# Patient Record
Sex: Female | Born: 1970 | Race: Black or African American | Hispanic: No | Marital: Single | Smoking: Never smoker
Health system: Southern US, Community
[De-identification: ages and names within clinical notes are randomized; demographics above are authoritative.]

## PROBLEM LIST (undated history)

## (undated) DIAGNOSIS — I5022 Chronic systolic (congestive) heart failure: Secondary | ICD-10-CM

## (undated) DIAGNOSIS — I428 Other cardiomyopathies: Secondary | ICD-10-CM

## (undated) DIAGNOSIS — R002 Palpitations: Secondary | ICD-10-CM

## (undated) DIAGNOSIS — M549 Dorsalgia, unspecified: Secondary | ICD-10-CM

## (undated) DIAGNOSIS — I10 Essential (primary) hypertension: Secondary | ICD-10-CM

## (undated) DIAGNOSIS — R079 Chest pain, unspecified: Secondary | ICD-10-CM

## (undated) HISTORY — DX: Palpitations: R00.2

## (undated) HISTORY — DX: Dorsalgia, unspecified: M54.9

## (undated) HISTORY — DX: Essential (primary) hypertension: I10

## (undated) HISTORY — DX: Chest pain, unspecified: R07.9

---

## 2008-08-23 HISTORY — PX: MYOMECTOMY: SHX85

## 2009-05-28 ENCOUNTER — Inpatient Hospital Stay (HOSPITAL_COMMUNITY): Admission: RE | Admit: 2009-05-28 | Discharge: 2009-05-30 | Payer: Self-pay | Admitting: Obstetrics and Gynecology

## 2009-05-28 ENCOUNTER — Encounter (INDEPENDENT_AMBULATORY_CARE_PROVIDER_SITE_OTHER): Payer: Self-pay | Admitting: Obstetrics and Gynecology

## 2010-11-26 LAB — BASIC METABOLIC PANEL
BUN: 9 mg/dL (ref 6–23)
Chloride: 106 mEq/L (ref 96–112)
Creatinine, Ser: 0.75 mg/dL (ref 0.4–1.2)
GFR calc non Af Amer: >60 mL/min (ref >60)

## 2010-11-26 LAB — URINALYSIS, ROUTINE W REFLEX MICROSCOPIC
Bilirubin Urine: NEGATIVE
Nitrite: NEGATIVE
Specific Gravity, Urine: 1.01 (ref 1.005–1.030)
Urobilinogen, UA: 0.2 mg/dL (ref 0.0–1.0)

## 2010-11-26 LAB — CBC
HCT: 22.1 % — ABNORMAL LOW (ref 36.0–46.0)
Hemoglobin: 7.4 g/dL — CL (ref 12.0–15.0)
MCHC: 33.3 g/dL (ref 30.0–36.0)
MCV: 81.2 fL (ref 78.0–100.0)
MCV: 81.8 fL (ref 78.0–100.0)
Platelets: 279 10*3/uL (ref 150–400)
RBC: 2.72 MIL/uL — ABNORMAL LOW (ref 3.87–5.11)
WBC: 5.1 10*3/uL (ref 4.0–10.5)

## 2010-11-26 LAB — TSH: TSH: 0.952 u[IU]/mL (ref 0.350–4.500)

## 2010-11-26 LAB — PREGNANCY, URINE: Preg Test, Ur: NEGATIVE

## 2010-11-26 LAB — URINE MICROSCOPIC-ADD ON

## 2014-04-19 ENCOUNTER — Encounter: Payer: Self-pay | Admitting: *Deleted

## 2019-04-24 DEATH — deceased

## 2021-01-28 ENCOUNTER — Other Ambulatory Visit: Payer: Self-pay | Admitting: Radiology

## 2021-02-03 ENCOUNTER — Inpatient Hospital Stay (HOSPITAL_COMMUNITY)
Admission: EM | Admit: 2021-02-03 | Discharge: 2021-02-06 | DRG: 286 | Disposition: A | Discharge status: Home / Self Care | Payer: BC Managed Care – PPO | Attending: Internal Medicine | Admitting: Internal Medicine

## 2021-02-03 ENCOUNTER — Encounter (HOSPITAL_COMMUNITY): Payer: Self-pay

## 2021-02-03 DIAGNOSIS — I110 Hypertensive heart disease with heart failure: Secondary | ICD-10-CM | POA: Diagnosis not present

## 2021-02-03 DIAGNOSIS — I5021 Acute systolic (congestive) heart failure: Secondary | ICD-10-CM | POA: Diagnosis present

## 2021-02-03 DIAGNOSIS — I459 Conduction disorder, unspecified: Secondary | ICD-10-CM | POA: Diagnosis present

## 2021-02-03 DIAGNOSIS — R7989 Other specified abnormal findings of blood chemistry: Secondary | ICD-10-CM

## 2021-02-03 DIAGNOSIS — I272 Pulmonary hypertension, unspecified: Secondary | ICD-10-CM | POA: Diagnosis present

## 2021-02-03 DIAGNOSIS — I517 Cardiomegaly: Secondary | ICD-10-CM | POA: Diagnosis not present

## 2021-02-03 DIAGNOSIS — I428 Other cardiomyopathies: Secondary | ICD-10-CM | POA: Diagnosis present

## 2021-02-03 DIAGNOSIS — Z8042 Family history of malignant neoplasm of prostate: Secondary | ICD-10-CM

## 2021-02-03 DIAGNOSIS — N289 Disorder of kidney and ureter, unspecified: Secondary | ICD-10-CM | POA: Diagnosis present

## 2021-02-03 DIAGNOSIS — I371 Nonrheumatic pulmonary valve insufficiency: Secondary | ICD-10-CM | POA: Diagnosis present

## 2021-02-03 DIAGNOSIS — Z8249 Family history of ischemic heart disease and other diseases of the circulatory system: Secondary | ICD-10-CM

## 2021-02-03 DIAGNOSIS — E876 Hypokalemia: Secondary | ICD-10-CM | POA: Diagnosis present

## 2021-02-03 DIAGNOSIS — R0683 Snoring: Secondary | ICD-10-CM | POA: Diagnosis present

## 2021-02-03 DIAGNOSIS — Z20822 Contact with and (suspected) exposure to covid-19: Secondary | ICD-10-CM | POA: Diagnosis present

## 2021-02-03 DIAGNOSIS — I509 Heart failure, unspecified: Secondary | ICD-10-CM

## 2021-02-03 DIAGNOSIS — Z79899 Other long term (current) drug therapy: Secondary | ICD-10-CM

## 2021-02-03 DIAGNOSIS — I5042 Chronic combined systolic (congestive) and diastolic (congestive) heart failure: Secondary | ICD-10-CM | POA: Diagnosis present

## 2021-02-03 DIAGNOSIS — R609 Edema, unspecified: Secondary | ICD-10-CM

## 2021-02-03 NOTE — ED Triage Notes (Signed)
Pt reports that she has bilateral leg swelling that has been going on for a few months, cough that goes on at night, denies SOB

## 2021-02-03 NOTE — ED Provider Notes (Signed)
Emergency Medicine Provider Triage Evaluation Note  Wendy Stephens , a 50 y.o. female  was evaluated in triage.  Pt complains of peripheral edema, worsening over 3 weeks.  Some new cough and SOB with lying flat and exertion.  Denies chest pain.  Doctor recently changed BP meds.  Has been on lasix for 2 days now without change.  Review of Systems  Positive: Peripheral edema, SOB Negative: Chest pain, abdominal pain  Physical Exam  BP (!) 145/97 (BP Location: Left Arm)   Pulse 88   Temp (!) 97.5 F (36.4 C) (Oral)   Resp 18   SpO2 98%  Gen:   Awake, no distress   Resp:  Normal effort, grossly clear MSK:   Moves extremities without difficulty, 2+ pitting edema from ankles up to knees  Medical Decision Making  Medically screening exam initiated at 11:55 PM.  Appropriate orders placed.  SHONICE WRISLEY was informed that the remainder of the evaluation will be completed by another provider, this initial triage assessment does not replace that evaluation, and the importance of remaining in the ED until their evaluation is complete.   Garlon Hatchet, PA-C 02/03/21 2357    Milagros Loll, MD 02/05/21 [phone]

## 2021-02-04 ENCOUNTER — Emergency Department (HOSPITAL_COMMUNITY): Payer: BC Managed Care – PPO

## 2021-02-04 DIAGNOSIS — I509 Heart failure, unspecified: Secondary | ICD-10-CM

## 2021-02-04 DIAGNOSIS — I10 Essential (primary) hypertension: Secondary | ICD-10-CM

## 2021-02-04 LAB — CBC WITH DIFFERENTIAL/PLATELET
Abs Immature Granulocytes: 0.01 10*3/uL (ref 0.00–0.07)
Basophils Absolute: 0 10*3/uL (ref 0.0–0.1)
Basophils Relative: 1 %
Eosinophils Absolute: 0 10*3/uL (ref 0.0–0.5)
Eosinophils Relative: 1 %
HCT: 39.6 % (ref 36.0–46.0)
Hemoglobin: 12.8 g/dL (ref 12.0–15.0)
Immature Granulocytes: 0 %
Lymphocytes Relative: 55 %
Lymphs Abs: 2.7 10*3/uL (ref 0.7–4.0)
MCH: 26.9 pg (ref 26.0–34.0)
MCHC: 32.3 g/dL (ref 30.0–36.0)
MCV: 83.4 fL (ref 80.0–100.0)
Monocytes Absolute: 0.3 10*3/uL (ref 0.1–1.0)
Monocytes Relative: 6 %
Neutro Abs: 1.8 10*3/uL (ref 1.7–7.7)
Neutrophils Relative %: 37 %
Platelets: 233 10*3/uL (ref 150–400)
RBC: 4.75 MIL/uL (ref 3.87–5.11)
RDW: 15.3 % (ref 11.5–15.5)
WBC: 4.8 10*3/uL (ref 4.0–10.5)
nRBC: 0 % (ref 0.0–0.2)

## 2021-02-04 LAB — BASIC METABOLIC PANEL
Anion gap: 7 (ref 5–15)
BUN: 20 mg/dL (ref 6–20)
CO2: 27 mmol/L (ref 22–32)
Calcium: 8.6 mg/dL — ABNORMAL LOW (ref 8.9–10.3)
Chloride: 103 mmol/L (ref 98–111)
Creatinine, Ser: 1.1 mg/dL — ABNORMAL HIGH (ref 0.44–1.00)
GFR, Estimated: >60 mL/min (ref >60)
Glucose, Bld: 107 mg/dL — ABNORMAL HIGH (ref 70–99)
Potassium: 3.7 mmol/L (ref 3.5–5.1)
Sodium: 137 mmol/L (ref 135–145)

## 2021-02-04 LAB — RESP PANEL BY RT-PCR (FLU A&B, COVID) ARPGX2
Influenza A by PCR: NEGATIVE
Influenza B by PCR: NEGATIVE
SARS Coronavirus 2 by RT PCR: NEGATIVE

## 2021-02-04 LAB — CBC
HCT: 41.5 % (ref 36.0–46.0)
Hemoglobin: 13.7 g/dL (ref 12.0–15.0)
MCH: 27.1 pg (ref 26.0–34.0)
MCHC: 33 g/dL (ref 30.0–36.0)
MCV: 82.2 fL (ref 80.0–100.0)
Platelets: 243 10*3/uL (ref 150–400)
RBC: 5.05 MIL/uL (ref 3.87–5.11)
RDW: 15.1 % (ref 11.5–15.5)
WBC: 5.3 10*3/uL (ref 4.0–10.5)
nRBC: 0 % (ref 0.0–0.2)

## 2021-02-04 LAB — TROPONIN I (HIGH SENSITIVITY): Troponin I (High Sensitivity): 17 ng/L (ref <18)

## 2021-02-04 LAB — BRAIN NATRIURETIC PEPTIDE: B Natriuretic Peptide: 1213.4 pg/mL — ABNORMAL HIGH (ref 0.0–100.0)

## 2021-02-04 LAB — TSH: TSH: 0.782 u[IU]/mL (ref 0.350–4.500)

## 2021-02-04 MED ORDER — SODIUM CHLORIDE 0.9% FLUSH
3.0000 mL | Freq: Two times a day (BID) | INTRAVENOUS | Status: DC
  Administered 2021-02-04 – 2021-02-05 (×2): 3 mL via INTRAVENOUS

## 2021-02-04 MED ORDER — SODIUM CHLORIDE 0.9% FLUSH: 3.0000 mL | INTRAVENOUS | Status: DC | PRN

## 2021-02-04 MED ORDER — ONDANSETRON HCL 4 MG/2ML IJ SOLN: 4.0000 mg | Freq: Four times a day (QID) | INTRAMUSCULAR | Status: DC | PRN

## 2021-02-04 MED ORDER — SACUBITRIL-VALSARTAN 24-26 MG PO TABS
1.0000 | ORAL_TABLET | Freq: Two times a day (BID) | ORAL | Status: DC
  Administered 2021-02-04 – 2021-02-05 (×3): 1 via ORAL
  Filled 2021-02-04 (×4): qty 1

## 2021-02-04 MED ORDER — SODIUM CHLORIDE 0.9 % IV SOLN: 250.0000 mL | INTRAVENOUS | Status: DC | PRN

## 2021-02-04 MED ORDER — FUROSEMIDE 10 MG/ML IJ SOLN
40.0000 mg | Freq: Once | INTRAMUSCULAR | Status: AC
  Administered 2021-02-04: 40 mg via INTRAVENOUS
  Filled 2021-02-04: qty 4

## 2021-02-04 MED ORDER — FUROSEMIDE 10 MG/ML IJ SOLN
40.0000 mg | Freq: Two times a day (BID) | INTRAMUSCULAR | Status: DC
  Administered 2021-02-04 – 2021-02-05 (×3): 40 mg via INTRAVENOUS
  Filled 2021-02-04 (×4): qty 4

## 2021-02-04 MED ORDER — ENOXAPARIN SODIUM 40 MG/0.4ML IJ SOSY
40.0000 mg | PREFILLED_SYRINGE | INTRAMUSCULAR | Status: DC
  Administered 2021-02-04 – 2021-02-05 (×2): 40 mg via SUBCUTANEOUS
  Filled 2021-02-04 (×2): qty 0.4

## 2021-02-04 MED ORDER — ACETAMINOPHEN 325 MG PO TABS: 650.0000 mg | ORAL_TABLET | ORAL | Status: DC | PRN

## 2021-02-04 NOTE — H&P (Addendum)
Cardiology Admission History and Physical:   Patient ID: Wendy Stephens MRN: [id number]; DOB: May 30, 1971   Admission date: 02/03/2021  PCP:  System, Provider Not In   [facility] Providers Cardiologist:  New to [facility]; To follow with Dr. Duke Salvia going forward.  Click here to update MD or APP on Care Team, Refresh:1}     Chief Complaint:  LE swelling and SOB  Patient Profile:   Wendy Stephens is a 50 y.o. female with a PMH of HTN, who is being seen 02/04/2021 for the evaluation of CHF.  History of Present Illness:   Wendy Stephens was in her usual state of health until about 6 months ago when she noticed gradual increase in LE edema. She has followed with her PCP for this issue in the past and increased her HCTZ, as well as taking intermittent short courses of lasix. Over the past month she has gain ~10 lbs with significant worsening of her LE edema over the past 3 weeks. She reported for the past week she has ben sleeping in a recliner due to orthopnea. She has had some DOE and occasionally notices a "rattling" sensation in her chest. Due to these issues she was scheduled to see Dr. Duke Salvia 03/05/21. She has never undergone any cardiac testing in the past. She reported increasing her lasix over the past couple days with no significant change in UOP. At the encouragement from a family member who was concerned about possible heart failure, she presented to the ED for further evaluation.  In the ED she is generally hypertensive, tachypneic, intermittently tachycardic, afebrile, satting well on RA. Labs notable for electrolytes wnl, Cr 1.1, CBC wnl, HsTrop 17, BNP 1213. EKG showed sinus rhythm, rate 84 bpm, non-specific IVCD, no STE/D. CXR showed cardiomegaly vs pericardial effusion and streaky atelectasis. She was given IV lasix 40mg  in the ED. Cardiology asked to evaluate.   At the time of this evaluation she reports some improvement in her LE edema. In hindsight her LE edema dates  back almost a year, starting with mild swelling in her ankles/feet. She has had some DOE more recently though only noticeable when going up a flight of stairs. She denies any chest pain at rest or with exertion. She has had orthopnea and coughing when laying down though no PND. She does reports snoring and daytime somnolence but has not had any prior sleep study testing to evaluate for OSA. She denies palpitations, recent viral illnesses, dizziness, lightheadedness, or syncope. She reports a strong family history of CHF - mother with ICD for cardiomyopathy in her 75s, ultimately passed from MI at 9; maternal aunt and grandmother with CHF, as well as a cousin who had SCD at age 67. Family had previously been recommended to undergo genetic testing, though this has not occurred. She denies tobacco use, drinks 1-2 glasses of wine per week, and denies recreational drug use.    Past Medical History:  Diagnosis Date   Back pain    Chest pain, unspecified    Hypertension    Palpitations     Past Surgical History:  Procedure Laterality Date   MYOMECTOMY  2010     Medications Prior to Admission: Prior to Admission medications   Medication Sig Start Date End Date Taking? Authorizing Provider  candesartan (ATACAND) 32 MG tablet Take 32 mg by mouth daily. 01/19/21  Yes [provider]  furosemide (LASIX) 20 MG tablet Take 20 mg by mouth daily.   Yes [provider]  sodium chloride (OCEAN) 0.65 % SOLN nasal spray Place 1 spray into both nostrils daily.   Yes [provider]     Allergies:   No Known Allergies  Social History:   Social History   Socioeconomic History   Marital status: Single    Spouse name: Not on file   Number of children: Not on file   Years of education: Not on file   Highest education level: Not on file  Occupational History   Not on file  Tobacco Use   Smoking status: Never   Smokeless tobacco: Not on file  Substance and Sexual Activity    Alcohol use: Yes    Comment: 1-2 per week   Drug use: No   Sexual activity: Not on file  Other Topics Concern   Not on file  Social History Narrative   Not on file   Social Determinants of Health   Financial Resource Strain: Not on file  Food Insecurity: Not on file  Transportation Needs: Not on file  Physical Activity: Not on file  Stress: Not on file  Social Connections: Not on file  Intimate Partner Violence: Not on file    Family History:   The patient's family history includes CAD in her mother; Hypertension in her mother; Prostate cancer in her father. There is no history of Anemia, Arrhythmia, Asthma, Clotting disorder, Fainting, Heart disease, Heart failure, or Hyperlipidemia.    ROS:  Please see the history of present illness.   All other ROS reviewed and negative.     Physical Exam/Data:   Vitals:   02/04/21 1435 02/04/21 1500 02/04/21 1515 02/04/21 1530  BP: (!) 131/91 135/89 136/89 117/83  Pulse: 80 76 77 80  Resp: (!) 27 (!) 23 (!) 23 15  Temp:      TempSrc:      SpO2: 100% 100% 100% 90%   No intake or output data in the 24 hours ending 02/04/21 1620 No flowsheet data found.   There is no height or weight on file to calculate BMI.  General:  Well nourished, well developed, in no acute distress HEENT: sclera anicteric Lymph: no adenopathy Neck: no JVD Endocrine:  No thryomegaly Vascular: No carotid bruits; FA pulses 2+ bilaterally without bruits  Cardiac:  normal S1, S2; RRR; no murmur Lungs:  clear to auscultation bilaterally, no wheezing, rhonchi or rales  Abd: soft, nontender, no hepatomegaly  Ext: 2-3+ LE edema Musculoskeletal:  No deformities, BUE and BLE strength normal and equal Skin: warm and dry  Neuro:  CNs 2-12 intact, no focal abnormalities noted Psych:  Normal affect    EKG:  sinus rhythm, rate 84 bpm, non-specific IVCD, no STE/D; no comparison  Relevant CV Studies: Echo pending  Laboratory Data:  High Sensitivity Troponin:    Recent Labs  Lab 02/03/21 2356  TROPONINIHS 17      Chemistry Recent Labs  Lab 02/03/21 2356  NA 137  K 3.7  CL 103  CO2 27  GLUCOSE 107*  BUN 20  CREATININE 1.10*  CALCIUM 8.6*  GFRNONAA >60  ANIONGAP 7    No results for input(s): PROT, ALBUMIN, AST, ALT, ALKPHOS, BILITOT in the last 168 hours. Hematology Recent Labs  Lab 02/03/21 2356  WBC 4.8  RBC 4.75  HGB 12.8  HCT 39.6  MCV 83.4  MCH 26.9  MCHC 32.3  RDW 15.3  PLT 233   BNP Recent Labs  Lab 02/03/21 2356  BNP 1,213.4*    DDimer No results for  input(s): DDIMER in the last 168 hours.   Radiology/Studies:  DG Chest 2 View  Result Date: 02/04/2021 CLINICAL DATA:  Bilateral leg swelling. EXAM: CHEST - 2 VIEW COMPARISON:  None. FINDINGS: Streaky atelectatic changes including more bandlike subsegmental atelectasis or scarring in the lung bases. No consolidation, features of edema, pneumothorax, or effusion. Pulmonary vascularity remains normally distributed. Enlarged cardiac silhouette. Remaining cardiomediastinal contours are unremarkable. Levocurvature of the thoracolumbar junction. IMPRESSION: Enlarged cardiac silhouette, can reflect cardiomegaly versus pericardial effusion. Streaky atelectatic changes. Levocurvature of the thoracolumbar junction. Electronically Signed   By: Kreg Shropshire M.D.   On: 02/04/2021 00:21     Assessment and Plan:   Acute CHF: patient presented with progressive LE edema, DOE, weight gain, and orthopnea. EKG with non-specific IVCD though non-ischemic. HsTrop negative x1. BNP 1200. CXR suggested cardiomegaly vs pericardial effusion. She was given IV lasix 40mg  - UOP not documented. She has no prior cardiac history aside from HTN. She does have a strong family history of CHF as detailed above. Suspicions very high for combined CHF - Will check an echocardiogram - Will check TSH/FLP/A1C for risk stratification - Continue IV lasix 40mg  BID - Will monitor strict I&Os and daily weights -  Will monitor electrolytes closely and replete as needed to maintain K >4, Mg >2 - Anticipate addition of Bblocker prior to discharge once out of the acute phase - Will start entresto and titrate as tolerated.   HTN: BP mildly elevated.   - Anticipate management in the context of CHF  Suspected OSA: patient reports snoring and daytime somnolence.  - Would benefit from an outpatient sleep study   Risk Assessment/Risk Scores:    New York Heart Association (NYHA) Functional Class NYHA Class II     Severity of Illness: The appropriate patient status for this patient is OBSERVATION. Observation status is judged to be reasonable and necessary in order to provide the required intensity of service to ensure the patient's safety. The patient's presenting symptoms, physical exam findings, and initial radiographic and laboratory data in the context of their medical condition is felt to place them at decreased risk for further clinical deterioration. Furthermore, it is anticipated that the patient will be medically stable for discharge from the hospital within 2 midnights of admission. The following factors support the patient status of observation.   " The patient's presenting symptoms include LE edema, SOB, weight gain, orthopnea. " The physical exam findings include 2-3+ LE edema. " The initial radiographic and laboratory data are cardiomegaly on CXR, BNP 1200s.    For questions or updates, please contact [facility] Please consult [email or web address] for contact info under     Signed, , PA-C  02/04/2021 4:20 PM   Personally seen and examined. Agree with above.  50 year old female with strong family history of cardiomyopathy, worsening shortness of breath, weight gain, no chest pain no syncope no palpitations here for the evaluation of heart failure.  She has had 2 separate nosebleeds since July.  1 she had to go to the emergency department.   She also about a month ago had  a tick bite on her left upper leg posteriorly.  Currently fairly comfortable in bed.  Family surrounding.  Many of her family members see Dr. 54 in heart care.  Chest x-ray personally reviewed demonstrates cardiomegaly.  BNP is 1200.  EKG shows interventricular conduction delay with atrial enlargement.    On exam she is alert and oriented x3 in no acute distress.  Mild crackles heard at bases of lungs.  Heart is regular rate and rhythm with S3 gallop.  Midneck JVD noted.  2+ lower extremity edema.  Echocardiogram pending  Assessment and plan:  Acute heart failure, suspect systolic heart failure, possibly familial given her family history or in part hypertensive. -We will go ahead and admit her to the telemetry unit.  Place her on IV Lasix 40 mg twice daily.  Monitor her renal function closely.  Monitor electrolytes.  Check TSH. - When able/euvolemic, initiate beta-blocker, possibly carvedilol.  Her mother was on carvedilol previously. -I will go ahead and start Entresto.  Given her cardiomegaly on chest x-ray, I would suspect systolic dysfunction. -If systolic dysfunction is present, adding Jardiance or Marcelline Deist will also be important. -She will be n.p.o. past midnight.  If she is able to lay flat comfortably, we will consider cardiac catheterization.  Heart rate slightly too fast for optimal cardiac CT.  Donato Schultz, MD

## 2021-02-04 NOTE — ED Provider Notes (Signed)
50 year old female here with increased peripheral edema and orthopnea.  PCP has had on HCTZ and now Lasix without any improvement.  Chest x-ray reading is cardiomegaly versus effusion.  BNP elevated.  Bedside ultrasound done and does not show any significant cardiac effusion.  EF does seem a little bit down.  Will need cardiology consult likely admission for diuresis.  Ultrasound ED Echo  Date/Time: 02/04/2021 9:33 AM Performed by: Terrilee Files, MD Authorized by: Terrilee Files, MD   Procedure details:    Indications: dyspnea     Views: subxiphoid, parasternal long axis view, parasternal short axis view and apical 4 chamber view     Images: archived   Findings:    Pericardium: no pericardial effusion     LV Function: depressed (30 - 50%)   Impression:    Impression: decreased contractility      Terrilee Files, MD 02/04/21 2054

## 2021-02-04 NOTE — ED Provider Notes (Signed)
[facility] EMERGENCY DEPARTMENT Provider Note   CSN: [id number] Arrival date & time: 02/03/21  2339     History Chief Complaint  Patient presents with   Leg Swelling    Wendy Stephens is a 50 y.o. female with past medical history significant for hypertension who presents for evaluation of lower extremity swelling.  Patient states she has been having increased lower extremity swelling over the last 6 months however worse over the last 3 weeks.  Patient states PCP has tried increasing her HCTZ as well as intermittent Lasix bursts.  States over the last month she has gained 10 pounds. Her lower extremity swelling initially was in her feet and now progressed t from lower extremities to just proximal to knees.  She feels like her bilateral thighs are starting to tighten up as well without pitting edema. No surrounding erythema, warmth  She states over the last week she is having to sleep in a recliner and uses 3 pillows as well.  Admits to Orthopnea. She feels short of breath and that she intermittently has "rattling" in her chest.  She denies any chest pain.  She does get short of breath with exertion which has been chronic since July of last year. No known CAD however does have family hx of CAD.  She has had a cough productive of white sputum over the last 2 months.  Has appointment to follow-up with Dr. Duke Salvia in greater than 1 month with cardiology to establish care.  She has never had an echocardiogram.  No fever, chills, nausea, vomiting, abdominal pain, diarrhea, dysuria.  Took 2 days worth of 20 mg Lasix over the last 2 days however has not had any increase in urination.  Family member that is present works in the [employer] and is concerned about heart failure.  No pain.  Denies additional aggravating or alleviating factors.  History obtained from patient, family in room and past medical records.  No interpreter is used  PCP- [facility]  HPI     Past Medical History:   Diagnosis Date   Back pain    Chest pain, unspecified    Hypertension    Palpitations     There are no problems to display for this patient.   Past Surgical History:  Procedure Laterality Date   MYOMECTOMY  2010     OB History   No obstetric history on file.     Family History  Problem Relation Age of Onset   Hypertension Mother    CAD Mother    Prostate cancer Father    Anemia Neg Hx    Arrhythmia Neg Hx    Asthma Neg Hx    Clotting disorder Neg Hx    Fainting Neg Hx    Heart disease Neg Hx    Heart failure Neg Hx    Hyperlipidemia Neg Hx     Social History   Tobacco Use   Smoking status: Never  Substance Use Topics   Alcohol use: Yes    Comment: 1-2 per week   Drug use: No    Home Medications Prior to Admission medications   Medication Sig Start Date End Date Taking? Authorizing Provider  candesartan (ATACAND) 32 MG tablet Take 32 mg by mouth daily. 01/19/21  Yes [provider]  furosemide (LASIX) 20 MG tablet Take 20 mg by mouth daily.   Yes [provider]  sodium chloride (OCEAN) 0.65 % SOLN nasal spray Place 1 spray into both nostrils daily.  Yes [provider]    Allergies    Patient has no known allergies.  Review of Systems   Review of Systems  Constitutional:  Positive for fatigue. Negative for chills, diaphoresis, fever and unexpected weight change.  HENT: Negative.    Respiratory:  Positive for cough and shortness of breath. Negative for apnea, choking, chest tightness, wheezing and stridor.   Cardiovascular:  Positive for leg swelling (BL edema). Negative for chest pain and palpitations.  Gastrointestinal: Negative.   Genitourinary: Negative.   Musculoskeletal: Negative.   Skin: Negative.   Neurological: Negative.   All other systems reviewed and are negative.  Physical Exam Updated Vital Signs BP (!) 131/91   Pulse 80   Temp 98.3 F (36.8 C) (Oral)   Resp (!) 27   LMP 01/19/2021   SpO2 100%    Physical Exam Vitals and nursing note reviewed.  Constitutional:      General: She is not in acute distress.    Appearance: She is well-developed. She is not ill-appearing, toxic-appearing or diaphoretic.  HENT:     Head: Normocephalic and atraumatic.     Nose: Nose normal.     Mouth/Throat:     Mouth: Mucous membranes are moist.  Eyes:     Pupils: Pupils are equal, round, and reactive to light.  Cardiovascular:     Rate and Rhythm: Normal rate.     Pulses:          Radial pulses are 2+ on the right side and 2+ on the left side.       Dorsalis pedis pulses are 1+ on the right side and 1+ on the left side.     Heart sounds: Normal heart sounds.  Pulmonary:     Effort: Pulmonary effort is normal. No respiratory distress.     Comments: Mild crackles at bases. No respiratory distress Abdominal:     General: Bowel sounds are normal. There is no distension.     Tenderness: There is no abdominal tenderness. There is no guarding or rebound.     Comments: No anasarca  Musculoskeletal:        General: Normal range of motion.     Cervical back: Normal range of motion.     Right lower leg: 3+ Pitting Edema present.     Left lower leg: 3+ Pitting Edema present.     Comments: 2-3+ pitting edema to proximal knees bilaterally. No bony tenderness.  Compartments soft. Homans negative  Skin:    General: Skin is warm and dry.     Capillary Refill: Capillary refill takes less than 2 seconds.     Comments: Pitting edema bilateral lower extremities.  No overlying erythema or warmth  Neurological:     General: No focal deficit present.     Mental Status: She is alert.     Cranial Nerves: Cranial nerves are intact.     Sensory: Sensation is intact.     Gait: Gait is intact.     Comments: Intact sensation  Psychiatric:        Mood and Affect: Mood normal.    ED Results / Procedures / Treatments   Labs (all labs ordered are listed, but only abnormal results are displayed) Labs Reviewed   BASIC METABOLIC PANEL - Abnormal; Notable for the following components:      Result Value   Glucose, Bld 107 (*)    Creatinine, Ser 1.10 (*)    Calcium 8.6 (*)    All other components  within normal limits  BRAIN NATRIURETIC PEPTIDE - Abnormal; Notable for the following components:   B Natriuretic Peptide 1,213.4 (*)    All other components within normal limits  CBC WITH DIFFERENTIAL/PLATELET  TROPONIN I (HIGH SENSITIVITY)    EKG EKG Interpretation  Date/Time:  Wednesday February 04 2021 00:04:23 EDT Ventricular Rate:  84 PR Interval:  176 QRS Duration: 140 QT Interval:  424 QTC Calculation: 501 R Axis:   210 Text Interpretation: Normal sinus rhythm Biatrial enlargement Non-specific intra-ventricular conduction block Possible Lateral infarct , age undetermined Abnormal ECG No old tracing to compare Confirmed by Meridee Score [phone]) on 02/04/2021 9:01:34 AM  Radiology DG Chest 2 View  Result Date: 02/04/2021 CLINICAL DATA:  Bilateral leg swelling. EXAM: CHEST - 2 VIEW COMPARISON:  None. FINDINGS: Streaky atelectatic changes including more bandlike subsegmental atelectasis or scarring in the lung bases. No consolidation, features of edema, pneumothorax, or effusion. Pulmonary vascularity remains normally distributed. Enlarged cardiac silhouette. Remaining cardiomediastinal contours are unremarkable. Levocurvature of the thoracolumbar junction. IMPRESSION: Enlarged cardiac silhouette, can reflect cardiomegaly versus pericardial effusion. Streaky atelectatic changes. Levocurvature of the thoracolumbar junction. Electronically Signed   By: Kreg Shropshire M.D.   On: 02/04/2021 00:21    Procedures Procedures   Medications Ordered in ED Medications  furosemide (LASIX) injection 40 mg (40 mg Intravenous Given 02/04/21 1031)    ED Course  I have reviewed the triage vital signs and the nursing notes.  Pertinent labs & imaging results that were available during my care of the patient were  reviewed by me and considered in my medical decision making (see chart for details).  50 year old here for evaluation of lower extremity swelling as well as new onset PND orthopnea over the last week.  She is afebrile, nonseptic, not ill-appearing.  Unfortunately seems like her lower extremity swelling has been chronic her last few months and worsening despite home Lasix and increase of her HCTZ.  Has some crackles at her bases however no hypoxia or tachypnea.  Work-up started from triage which I personally reviewed and interpreted:  CBC without leukocytosis Metabolic panel glucose 107, creatinine 1.10, mild increase in baseline Trop 17 BNP one 1213.4 Chest x-ray shows cardiomegaly versus pericardial effusion  Bedside echo with attending Dr. Charm Barges does not show evidence of pericardial effusion.  Will give some Lasix.  We will touch base with cardiology for dispo  CONSULT with Trish with Cards. Will send someone to see patient in ED.  Patient reassessed. Cardiology has not been by to see as of yet. Discussed plan with patient and family. Agreeable to wait to see Cards for rec. Good diuresis with IV lasix here in ED.   Care transferred to [facility], PA-C who will FU on Cards rec and dispo appropriately.    MDM Rules/Calculators/A&P                           Final Clinical Impression(s) / ED Diagnoses Final diagnoses:  Peripheral edema  Elevated brain natriuretic peptide (BNP) level  Cardiomegaly    Rx / DC Orders ED Discharge Orders     None        Christophere Hillhouse A, PA-C 02/04/21 1521    Terrilee Files, MD 02/04/21 2054

## 2021-02-05 ENCOUNTER — Other Ambulatory Visit (HOSPITAL_COMMUNITY): Payer: Self-pay

## 2021-02-05 ENCOUNTER — Observation Stay (HOSPITAL_COMMUNITY): Payer: BC Managed Care – PPO

## 2021-02-05 ENCOUNTER — Encounter (HOSPITAL_COMMUNITY): Payer: Self-pay | Admitting: Cardiology

## 2021-02-05 DIAGNOSIS — I371 Nonrheumatic pulmonary valve insufficiency: Secondary | ICD-10-CM | POA: Diagnosis present

## 2021-02-05 DIAGNOSIS — I5021 Acute systolic (congestive) heart failure: Secondary | ICD-10-CM

## 2021-02-05 DIAGNOSIS — I459 Conduction disorder, unspecified: Secondary | ICD-10-CM | POA: Diagnosis present

## 2021-02-05 DIAGNOSIS — R7989 Other specified abnormal findings of blood chemistry: Secondary | ICD-10-CM | POA: Diagnosis present

## 2021-02-05 DIAGNOSIS — E876 Hypokalemia: Secondary | ICD-10-CM | POA: Diagnosis present

## 2021-02-05 DIAGNOSIS — N289 Disorder of kidney and ureter, unspecified: Secondary | ICD-10-CM | POA: Diagnosis present

## 2021-02-05 DIAGNOSIS — Z8042 Family history of malignant neoplasm of prostate: Secondary | ICD-10-CM | POA: Diagnosis not present

## 2021-02-05 DIAGNOSIS — Z006 Encounter for examination for normal comparison and control in clinical research program: Secondary | ICD-10-CM

## 2021-02-05 DIAGNOSIS — I110 Hypertensive heart disease with heart failure: Secondary | ICD-10-CM | POA: Diagnosis present

## 2021-02-05 DIAGNOSIS — I5042 Chronic combined systolic (congestive) and diastolic (congestive) heart failure: Secondary | ICD-10-CM

## 2021-02-05 DIAGNOSIS — I272 Pulmonary hypertension, unspecified: Secondary | ICD-10-CM | POA: Diagnosis present

## 2021-02-05 DIAGNOSIS — Z8249 Family history of ischemic heart disease and other diseases of the circulatory system: Secondary | ICD-10-CM | POA: Diagnosis not present

## 2021-02-05 DIAGNOSIS — I517 Cardiomegaly: Secondary | ICD-10-CM | POA: Diagnosis present

## 2021-02-05 DIAGNOSIS — I428 Other cardiomyopathies: Secondary | ICD-10-CM | POA: Diagnosis present

## 2021-02-05 DIAGNOSIS — R0683 Snoring: Secondary | ICD-10-CM | POA: Diagnosis present

## 2021-02-05 DIAGNOSIS — R0609 Other forms of dyspnea: Secondary | ICD-10-CM | POA: Diagnosis not present

## 2021-02-05 DIAGNOSIS — Z79899 Other long term (current) drug therapy: Secondary | ICD-10-CM | POA: Diagnosis not present

## 2021-02-05 DIAGNOSIS — Z20822 Contact with and (suspected) exposure to covid-19: Secondary | ICD-10-CM | POA: Diagnosis present

## 2021-02-05 HISTORY — DX: Chronic combined systolic (congestive) and diastolic (congestive) heart failure: I50.42

## 2021-02-05 LAB — BASIC METABOLIC PANEL
Anion gap: 8 (ref 5–15)
BUN: 21 mg/dL — ABNORMAL HIGH (ref 6–20)
CO2: 31 mmol/L (ref 22–32)
Calcium: 8.3 mg/dL — ABNORMAL LOW (ref 8.9–10.3)
Chloride: 102 mmol/L (ref 98–111)
Creatinine, Ser: 1.35 mg/dL — ABNORMAL HIGH (ref 0.44–1.00)
GFR, Estimated: 48 mL/min — ABNORMAL LOW (ref >60)
Glucose, Bld: 115 mg/dL — ABNORMAL HIGH (ref 70–99)
Potassium: 3.4 mmol/L — ABNORMAL LOW (ref 3.5–5.1)
Sodium: 141 mmol/L (ref 135–145)

## 2021-02-05 LAB — ECHOCARDIOGRAM COMPLETE
AR max vel: 1.28 cm2
AV Area VTI: 1.18 cm2
AV Area mean vel: 1.24 cm2
AV Mean grad: 4 mmHg
AV Peak grad: 7.3 mmHg
Ao pk vel: 1.35 m/s
Area-P 1/2: 6.17 cm2
Height: 61 in
S' Lateral: 4.3 cm
Weight: 2740.76 oz

## 2021-02-05 LAB — HEPATIC FUNCTION PANEL
ALT: 44 U/L (ref 0–44)
AST: 38 U/L (ref 15–41)
Albumin: 3 g/dL — ABNORMAL LOW (ref 3.5–5.0)
Alkaline Phosphatase: 43 U/L (ref 38–126)
Bilirubin, Direct: 0.5 mg/dL — ABNORMAL HIGH (ref 0.0–0.2)
Indirect Bilirubin: 1.3 mg/dL — ABNORMAL HIGH (ref 0.3–0.9)
Total Bilirubin: 1.8 mg/dL — ABNORMAL HIGH (ref 0.3–1.2)
Total Protein: 5.5 g/dL — ABNORMAL LOW (ref 6.5–8.1)

## 2021-02-05 LAB — HIV ANTIBODY (ROUTINE TESTING W REFLEX): HIV Screen 4th Generation wRfx: NONREACTIVE

## 2021-02-05 LAB — MRSA PCR SCREENING: MRSA by PCR: NEGATIVE

## 2021-02-05 MED ORDER — PERFLUTREN LIPID MICROSPHERE
1.0000 mL | INTRAVENOUS | Status: AC | PRN
  Administered 2021-02-05: 4 mL via INTRAVENOUS
  Filled 2021-02-05: qty 10

## 2021-02-05 MED ORDER — SPIRONOLACTONE 12.5 MG HALF TABLET
12.5000 mg | ORAL_TABLET | Freq: Every day | ORAL | Status: DC
  Administered 2021-02-05 – 2021-02-06 (×2): 12.5 mg via ORAL
  Filled 2021-02-05 (×2): qty 1

## 2021-02-05 MED ORDER — ASPIRIN 81 MG PO CHEW
81.0000 mg | CHEWABLE_TABLET | ORAL | Status: AC
  Administered 2021-02-06: 81 mg via ORAL
  Filled 2021-02-05: qty 1

## 2021-02-05 MED ORDER — POTASSIUM CHLORIDE CRYS ER 20 MEQ PO TBCR
40.0000 meq | EXTENDED_RELEASE_TABLET | Freq: Two times a day (BID) | ORAL | Status: AC
  Administered 2021-02-05 (×2): 40 meq via ORAL
  Filled 2021-02-05 (×2): qty 2

## 2021-02-05 MED ORDER — SODIUM CHLORIDE 0.9% FLUSH: 3.0000 mL | INTRAVENOUS | Status: DC | PRN

## 2021-02-05 MED ORDER — SODIUM CHLORIDE 0.9% FLUSH
3.0000 mL | Freq: Two times a day (BID) | INTRAVENOUS | Status: DC
  Administered 2021-02-05: 3 mL via INTRAVENOUS

## 2021-02-05 MED ORDER — SODIUM CHLORIDE 0.9 % IV SOLN: INTRAVENOUS | Status: DC

## 2021-02-05 MED ORDER — SODIUM CHLORIDE 0.9 % IV SOLN: 250.0000 mL | INTRAVENOUS | Status: DC | PRN

## 2021-02-05 NOTE — Research (Signed)
IDENTIFY Informed Consent                  Subject Name: Wendy Stephens    Subject met inclusion and exclusion criteria.  The informed consent form, study requirements and expectations were reviewed with the subject and questions and concerns were addressed prior to the signing of the consent form.  The subject verbalized understanding of the trial requirements.  The subject agreed to participate in the IDENTIFY trial and signed the informed consent at 16:47PM on 02/05/21.  The informed consent was obtained prior to performance of any protocol-specific procedures for the subject.  A copy of the signed informed consent was given to the subject and a copy was placed in the subject's medical record.   Meade Maw , Naval architect

## 2021-02-05 NOTE — Progress Notes (Signed)
Progress Note  Patient Name: Wendy Stephens Date of Encounter: 02/05/2021  [facility] Cardiologist: Chilton Si, MD   Subjective   Feels better, less SOB. Lost 10 pounds she states.   Inpatient Medications    Scheduled Meds:  enoxaparin (LOVENOX) injection  40 mg Subcutaneous Q24H   furosemide  40 mg Intravenous BID   sacubitril-valsartan  1 tablet Oral BID   sodium chloride flush  3 mL Intravenous Q12H   Continuous Infusions:  sodium chloride     PRN Meds: sodium chloride, acetaminophen, ondansetron (ZOFRAN) IV, perflutren lipid microspheres (DEFINITY) IV suspension, sodium chloride flush   Vital Signs    Vitals:   02/04/21 2305 02/05/21 0345 02/05/21 0620 02/05/21 0746  BP: 112/73 115/81  129/80  Pulse: 85 76  79  Resp: 20 18  18   Temp: 98.1 F (36.7 C) 98.4 F (36.9 C)  98.1 F (36.7 C)  TempSrc: Oral Oral  Oral  SpO2: 100% 99%  98%  Weight:   77.7 kg   Height:        Intake/Output Summary (Last 24 hours) at 02/05/2021 1041 Last data filed at 02/05/2021 0640 Gross per 24 hour  Intake 543 ml  Output 450 ml  Net 93 ml   Last 3 Weights 02/05/2021 02/04/2021  Weight (lbs) 171 lb 4.8 oz 174 lb 2.6 oz  Weight (kg) 77.7 kg 79 kg      Telemetry    NSR ICVD - Personally Reviewed  ECG    No new - Personally Reviewed  Physical Exam   GEN: No acute distress.   Neck: mid neck JVD Cardiac: RRR, no murmurs, rubs, S3 gallop.  Respiratory: Clear to auscultation bilaterally. GI: Soft, nontender, non-distended  MS: No edema; No deformity. Neuro:  Nonfocal  Psych: Normal affect   Labs    High Sensitivity Troponin:   Recent Labs  Lab 02/03/21 2356  TROPONINIHS 17      Chemistry Recent Labs  Lab 02/03/21 2356 02/05/21 0036  NA 137 141  K 3.7 3.4*  CL 103 102  CO2 27 31  GLUCOSE 107* 115*  BUN 20 21*  CREATININE 1.10* 1.35*  CALCIUM 8.6* 8.3*  GFRNONAA >60 48*  ANIONGAP 7 8     Hematology Recent Labs  Lab 02/03/21 2356  02/04/21 1918  WBC 4.8 5.3  RBC 4.75 5.05  HGB 12.8 13.7  HCT 39.6 41.5  MCV 83.4 82.2  MCH 26.9 27.1  MCHC 32.3 33.0  RDW 15.3 15.1  PLT 233 243    BNP Recent Labs  Lab 02/03/21 2356  BNP 1,213.4*     DDimer No results for input(s): DDIMER in the last 168 hours.   Radiology    DG Chest 2 View  Result Date: 02/04/2021 CLINICAL DATA:  Bilateral leg swelling. EXAM: CHEST - 2 VIEW COMPARISON:  None. FINDINGS: Streaky atelectatic changes including more bandlike subsegmental atelectasis or scarring in the lung bases. No consolidation, features of edema, pneumothorax, or effusion. Pulmonary vascularity remains normally distributed. Enlarged cardiac silhouette. Remaining cardiomediastinal contours are unremarkable. Levocurvature of the thoracolumbar junction. IMPRESSION: Enlarged cardiac silhouette, can reflect cardiomegaly versus pericardial effusion. Streaky atelectatic changes. Levocurvature of the thoracolumbar junction. Electronically Signed   By: 02/06/2021 M.D.   On: 02/04/2021 00:21   ECHOCARDIOGRAM COMPLETE  Result Date: 02/05/2021    ECHOCARDIOGRAM REPORT   Patient Name:   Wendy Stephens Date of Exam: 02/05/2021 Medical Rec #:  02/07/2021  Height:       61.0 in Accession #:    [id number]       Weight:       171.3 lb Date of Birth:  24-Oct-1970        BSA:          1.768 m Patient Age:    50 years         BP:           115/81 mmHg Patient Gender: F                HR:           76 bpm. Exam Location:  Inpatient Procedure: 2D Echo, Cardiac Doppler and Color Doppler Indications:    Dyspnea  History:        Patient has no prior history of Echocardiogram examinations.                 Risk Factors:Hypertension.  Sonographer:    Shirlean Kelly Referring Phys: [id number] Beatriz Stallion IMPRESSIONS  1. Left ventricular ejection fraction, by estimation, is 20 to 25%. The left ventricle has severely decreased function. The left ventricle demonstrates regional wall motion abnormalities  (see scoring diagram/findings for description). Left ventricular diastolic parameters are consistent with Grade II diastolic dysfunction (pseudonormalization). Elevated left ventricular end-diastolic pressure. There is akinesis of the left ventricular, entire inferoseptal wall, apical segment and inferior wall. There is akinesis of the left ventricular, apical anterior wall.  2. Right ventricular systolic function is normal. The right ventricular size is normal. Tricuspid regurgitation signal is inadequate for assessing PA pressure.  3. Left atrial size was mildly dilated.  4. Right atrial size was mildly dilated.  5. The mitral valve is normal in structure. Mild mitral valve regurgitation. No evidence of mitral stenosis.  6. The aortic valve is normal in structure. Aortic valve regurgitation is not visualized. No aortic stenosis is present.  7. The inferior vena cava is normal in size with greater than 50% respiratory variability, suggesting right atrial pressure of 3 mmHg. FINDINGS  Left Ventricle: Left ventricular ejection fraction, by estimation, is 20 to 25%. The left ventricle has severely decreased function. The left ventricle demonstrates regional wall motion abnormalities. The left ventricular internal cavity size was normal  in size. There is no left ventricular hypertrophy. Abnormal (paradoxical) septal motion, consistent with left bundle branch block. Left ventricular diastolic parameters are consistent with Grade II diastolic dysfunction (pseudonormalization). Elevated left ventricular end-diastolic pressure. Right Ventricle: The right ventricular size is normal. No increase in right ventricular wall thickness. Right ventricular systolic function is normal. Tricuspid regurgitation signal is inadequate for assessing PA pressure. Left Atrium: Left atrial size was mildly dilated. Right Atrium: Right atrial size was mildly dilated. Pericardium: There is no evidence of pericardial effusion. Mitral Valve: The  mitral valve is normal in structure. Mild mitral valve regurgitation. No evidence of mitral valve stenosis. Tricuspid Valve: The tricuspid valve is normal in structure. Tricuspid valve regurgitation is trivial. No evidence of tricuspid stenosis. Aortic Valve: The aortic valve is normal in structure. Aortic valve regurgitation is not visualized. No aortic stenosis is present. Aortic valve mean gradient measures 4.0 mmHg. Aortic valve peak gradient measures 7.3 mmHg. Aortic valve area, by VTI measures 1.18 cm. Pulmonic Valve: The pulmonic valve was normal in structure. Pulmonic valve regurgitation is mild. No evidence of pulmonic stenosis. Aorta: The aortic root is normal in size and structure. Venous: The inferior vena cava is normal in  size with greater than 50% respiratory variability, suggesting right atrial pressure of 3 mmHg. IAS/Shunts: No atrial level shunt detected by color flow Doppler.  LEFT VENTRICLE PLAX 2D LVIDd:         5.10 cm  Diastology LVIDs:         4.30 cm  LV e' medial:    3.90 cm/s LV PW:         1.10 cm  LV E/e' medial:  27.2 LV IVS:        1.00 cm  LV e' lateral:   7.28 cm/s LVOT diam:     1.80 cm  LV E/e' lateral: 14.6 LV SV:         31 LV SV Index:   17 LVOT Area:     2.54 cm  RIGHT VENTRICLE            IVC RV S prime:     8.18 cm/s  IVC diam: 1.70 cm TAPSE (M-mode): 2.2 cm LEFT ATRIUM             Index       RIGHT ATRIUM           Index LA diam:        3.70 cm 2.09 cm/m  RA Area:     20.10 cm LA Vol (A2C):   64.7 ml 36.59 ml/m RA Volume:   57.60 ml  32.57 ml/m LA Vol (A4C):   63.3 ml 35.79 ml/m LA Biplane Vol: 65.9 ml 37.27 ml/m  AORTIC VALVE AV Area (Vmax):    1.28 cm AV Area (Vmean):   1.24 cm AV Area (VTI):     1.18 cm AV Vmax:           135.00 cm/s AV Vmean:          89.000 cm/s AV VTI:            0.262 m AV Peak Grad:      7.3 mmHg AV Mean Grad:      4.0 mmHg LVOT Vmax:         67.90 cm/s LVOT Vmean:        43.200 cm/s LVOT VTI:          0.121 m LVOT/AV VTI ratio: 0.46   AORTA Ao Root diam: 2.50 cm Ao Asc diam:  2.80 cm MITRAL VALVE MV Area (PHT): 6.17 cm     SHUNTS MV Decel Time: 123 msec     Systemic VTI:  0.12 m MV E velocity: 106.00 cm/s  Systemic Diam: 1.80 cm MV A velocity: 62.90 cm/s MV E/A ratio:  1.69 Armanda Magic MD Electronically signed by Armanda Magic MD Signature Date/Time: 02/05/2021/10:13:29 AM    Final     Cardiac Studies   ECHO  1. Left ventricular ejection fraction, by estimation, is 20 to 25%. The  left ventricle has severely decreased function. The left ventricle  demonstrates regional wall motion abnormalities (see scoring  diagram/findings for description). Left ventricular  diastolic parameters are consistent with Grade II diastolic dysfunction  (pseudonormalization). Elevated left ventricular end-diastolic pressure.  There is akinesis of the left ventricular, entire inferoseptal wall,  apical segment and inferior wall. There  is akinesis of the left ventricular, apical anterior wall.   2. Right ventricular systolic function is normal. The right ventricular  size is normal. Tricuspid regurgitation signal is inadequate for assessing  PA pressure.   3. Left atrial size was mildly dilated.   4. Right atrial size was mildly  dilated.   5. The mitral valve is normal in structure. Mild mitral valve  regurgitation. No evidence of mitral stenosis.   6. The aortic valve is normal in structure. Aortic valve regurgitation is  not visualized. No aortic stenosis is present.   7. The inferior vena cava is normal in size with greater than 50%  respiratory variability, suggesting right atrial pressure of 3 mmHg.   Patient Profile     50 y.o. female with EF newly discovered 20%.  Strong family history of cardiomyopathy.  Here with acute systolic heart failure.  Assessment & Plan    Acute systolic heart failure -New start Entresto 24/26 mg twice daily.  Blood pressure much better. -IV Lasix 40 mg twice daily.  Mild increase in creatinine.   1.1-1.35.  Weight is down 3 pounds. -Adding on LFTs. --Cath today R and L. Risks and benefits discussed. OK to proceed.  --Discussing with AHF team.    Hypokalemia - Potassium 3.4.  We will replete.  Essential hypertension - Much better control currently.  Entresto.  Suspected obstructive sleep apnea - Outpatient sleep study would benefit.   For questions or updates, please contact [facility] Please consult [email or web address] for contact info under        Signed, Donato Schultz, MD  02/05/2021, 10:41 AM

## 2021-02-05 NOTE — Progress Notes (Signed)
Mobility Specialist: Progress Note   02/05/21 1502  Mobility  Activity Ambulated in hall  Level of Assistance Independent  Assistive Device None  Distance Ambulated (ft) 800 ft  Mobility Ambulated independently in hallway  Mobility Response Tolerated well  Mobility performed by Mobility specialist  $Mobility charge 1 Mobility   Pre-Mobility: 96 HR During Mobility: 120 HR Post-Mobility: 101 HR, 114/76 BP, 99% SpO2  Pt to BR, void successful, then agreeable to ambulate. Pt was asx during ambulation. Pt back to bed after walk with call bell at her side and her father present in the room.   [facility] Wendy Stephens Mobility Specialist Mobility Specialist Phone: [phone]

## 2021-02-05 NOTE — Progress Notes (Signed)
  Echocardiogram 2D Echocardiogram has been performed.  Shirlean Kelly 02/05/2021, 9:45 AM

## 2021-02-05 NOTE — Consult Note (Addendum)
Advanced Heart Failure Team Consult Note   Primary Physician: System, Provider Not In PCP-Cardiologist:  Chilton Si, MD  Reason for Consultation: New Systolic Heart Failure    HPI:    Wendy Stephens is seen today for evaluation of new systolic heart failure at the request of Dr. Anne Fu, Cardiology.   50 y/o AAF w/ h/o HTN that has been controlled w/ medications + FH of CHF on maternal side. Mother had heart failure, diagnosed in her 76s, w/ h/o cardiac arrest and ICD implanted for secondary prevention. 4/7 maternal aunts/uncles had CHF. 1 uncle was on milrinone and followed in the [facility]   She presented to [facility]  [facility] w/ complaint of progressive LEE, exertional dyspnea and orthopnea. LEE had been present for several months, and had previously been controlled w/ HCTZ, however over the last 3 months had gradually worsened. Later switched by PCP to Lasix. Recently w/ worsening edema and dyspnea + 10 lb gain and reduced response to Lasix.   In ED, she was found to be in CHF. BP not markedly high (131/81). BNP 1,213. HS trop 17 . Denies ischemic like CP. EKG NSR w/ BAE and NSIVCD. CXR w/ cardiomegaly but no frank edema. COVD and Flu negative. HIV NR. TSH WNL. SCr 1.10. K 3.7. Social drinker, drinks 1-2 glasses of wine/week. Denies tobacco. Believes she may have had COVID Jan 2020.  Has been told that she snores but has never had sleep study.   She has responded well to IV Lasix. Wt down. Feels better. Now able to lay flat w/o orthopnea. Entresto started. Mild bump in SCr 1.1>>1.4   Social: drinks 1-2 glasses of wine per week. Denies tobacco   Echo 02/05/21  ECHO  1. Left ventricular ejection fraction, by estimation, is 20 to 25%. The  left ventricle has severely decreased function. The left ventricle  demonstrates regional wall motion abnormalities (see scoring  diagram/findings for description). Left ventricular  diastolic parameters are consistent with Grade II diastolic  dysfunction  (pseudonormalization). Elevated left ventricular end-diastolic pressure.  There is akinesis of the left ventricular, entire inferoseptal wall,  apical segment and inferior wall. There  is akinesis of the left ventricular, apical anterior wall.   2. Right ventricular systolic function is normal. The right ventricular  size is normal. Tricuspid regurgitation signal is inadequate for assessing  PA pressure.   3. Left atrial size was mildly dilated.   4. Right atrial size was mildly dilated.   5. The mitral valve is normal in structure. Mild mitral valve  regurgitation. No evidence of mitral stenosis.   6. The aortic valve is normal in structure. Aortic valve regurgitation is  not visualized. No aortic stenosis is present.   7. The inferior vena cava is normal in size with greater than 50%  respiratory variability, suggesting right atrial pressure of 3 mmHg.   Review of Systems: [y] = yes, [ ]  = no   General: Weight gain [ ] ; Weight loss [ ] ; Anorexia [ ] ; Fatigue [ ] ; Fever [ ] ; Chills [ ] ; Weakness [ ]   Cardiac: Chest pain/pressure [ ] ; Resting SOB [ ] ; Exertional SOB [ Y]; Orthopnea [ Y]; Pedal Edema [ Y]; Palpitations [ ] ; Syncope [ ] ; Presyncope [ ] ; Paroxysmal nocturnal dyspnea[ ]   Pulmonary: Cough [ Y]; Wheezing[ ] ; Hemoptysis[ ] ; Sputum [ ] ; Snoring [ ]   GI: Vomiting[ ] ; Dysphagia[ ] ; Melena[ ] ; Hematochezia [ ] ; Heartburn[ ] ; Abdominal pain [ ] ; Constipation [ ] ;  Diarrhea [ ] ; BRBPR [ ]   GU: Hematuria[ ] ; Dysuria [ ] ; Nocturia[ ]   Vascular: Pain in legs with walking [ ] ; Pain in feet with lying flat [ ] ; Non-healing sores [ ] ; Stroke [ ] ; TIA [ ] ; Slurred speech [ ] ;  Neuro: Headaches[ ] ; Vertigo[ ] ; Seizures[ ] ; Paresthesias[ ] ;Blurred vision [ ] ; Diplopia [ ] ; Vision changes [ ]   Ortho/Skin: Arthritis [ ] ; Joint pain [ ] ; Muscle pain [ ] ; Joint swelling [ ] ; Back Pain [ ] ; Rash [ ]   Psych: Depression[ ] ; Anxiety[ ]   Heme: Bleeding problems [ ] ; Clotting disorders [ ] ;  Anemia [ ]   Endocrine: Diabetes [ ] ; Thyroid dysfunction[ ]   Home Medications Prior to Admission medications   Medication Sig Start Date End Date Taking? Authorizing Provider  candesartan (ATACAND) 32 MG tablet Take 32 mg by mouth daily. 01/19/21  Yes [provider]  furosemide (LASIX) 20 MG tablet Take 20 mg by mouth daily.   Yes [provider]  sodium chloride (OCEAN) 0.65 % SOLN nasal spray Place 1 spray into both nostrils daily.   Yes [provider]    Past Medical History: Past Medical History:  Diagnosis Date   Back pain    Chest pain, unspecified    Hypertension    Palpitations     Past Surgical History: Past Surgical History:  Procedure Laterality Date   MYOMECTOMY  2010    Family History: Family History  Problem Relation Age of Onset   Hypertension Mother    CAD Mother    Prostate cancer Father    Heart failure Maternal Aunt    Heart failure Maternal Aunt    Heart failure Maternal Aunt    Heart failure Maternal Uncle    Anemia Neg Hx    Arrhythmia Neg Hx    Asthma Neg Hx    Clotting disorder Neg Hx    Fainting Neg Hx    Hyperlipidemia Neg Hx     Social History: Social History   Socioeconomic History   Marital status: Single    Spouse name: Not on file   Number of children: Not on file   Years of education: Not on file   Highest education level: Not on file  Occupational History   Not on file  Tobacco Use   Smoking status: Never   Smokeless tobacco: Not on file  Substance and Sexual Activity   Alcohol use: Yes    Comment: 1-2 per week   Drug use: No   Sexual activity: Not on file  Other Topics Concern   Not on file  Social History Narrative   Not on file   Social Determinants of Health   Financial Resource Strain: Not on file  Food Insecurity: Not on file  Transportation Needs: Not on file  Physical Activity: Not on file  Stress: Not on file  Social Connections: Not on file    Allergies:  No Known  Allergies  Objective:    Vital Signs:   Temp:  [97.5 F (36.4 C)-98.4 F (36.9 C)] 97.5 F (36.4 C) (06/16 1632) Pulse Rate:  [76-89] 84 (06/16 1632) Resp:  [16-29] 18 (06/16 0746) BP: (112-140)/(73-89) 115/81 (06/16 1632) SpO2:  [98 %-100 %] 98 % (06/16 0746) Weight:  [77.7 kg-79 kg] 77.7 kg (06/16 0620)    Weight change: Filed Weights   02/04/21 1838 02/05/21 0620  Weight: 79 kg 77.7 kg    Intake/Output:   Intake/Output Summary (Last 24 hours) at 02/05/2021 1706  Last data filed at 02/05/2021 1634 Gross per 24 hour  Intake 543 ml  Output 2350 ml  Net -1807 ml      Physical Exam    General:  Well appearing, moderately obese. No resp difficulty HEENT: normal Neck: supple. JVP not elevated . Carotids 2+ bilat; no bruits. No lymphadenopathy or thyromegaly appreciated. Cor: PMI nondisplaced. Regular rate & rhythm. No rubs, gallops or murmurs. Lungs: clear Abdomen: soft, nontender, nondistended. No hepatosplenomegaly. No bruits or masses. Good bowel sounds. Extremities: no cyanosis, clubbing, rash, trace bilateral LE edema Neuro: alert & orientedx3, cranial nerves grossly intact. moves all 4 extremities w/o difficulty. Affect pleasant   Telemetry   NSR 80s   EKG    Sinus rhythm, Biatrial enlargement, Nonspecific intraventricular conduction delay  Labs   Basic Metabolic Panel: Recent Labs  Lab 02/03/21 2356 02/05/21 0036  NA 137 141  K 3.7 3.4*  CL 103 102  CO2 27 31  GLUCOSE 107* 115*  BUN 20 21*  CREATININE 1.10* 1.35*  CALCIUM 8.6* 8.3*    Liver Function Tests: Recent Labs  Lab 02/05/21 0036  AST 38  ALT 44  ALKPHOS 43  BILITOT 1.8*  PROT 5.5*  ALBUMIN 3.0*   No results for input(s): LIPASE, AMYLASE in the last 168 hours. No results for input(s): AMMONIA in the last 168 hours.  CBC: Recent Labs  Lab 02/03/21 2356 02/04/21 1918  WBC 4.8 5.3  NEUTROABS 1.8  --   HGB 12.8 13.7  HCT 39.6 41.5  MCV 83.4 82.2  PLT 233 243     Cardiac Enzymes: No results for input(s): CKTOTAL, CKMB, CKMBINDEX, TROPONINI in the last 168 hours.  BNP: BNP (last 3 results) Recent Labs    02/03/21 2356  BNP 1,213.4*    ProBNP (last 3 results) No results for input(s): PROBNP in the last 8760 hours.   CBG: No results for input(s): GLUCAP in the last 168 hours.  Coagulation Studies: No results for input(s): LABPROT, INR in the last 72 hours.   Imaging   ECHOCARDIOGRAM COMPLETE  Result Date: 02/05/2021    ECHOCARDIOGRAM REPORT   Patient Name:   Wendy Stephens Date of Exam: 02/05/2021 Medical Rec #:  [id number]        Height:       61.0 in Accession #:    [id number]       Weight:       171.3 lb Date of Birth:  Jul 07, 1971        BSA:          1.768 m Patient Age:    49 years         BP:           115/81 mmHg Patient Gender: F                HR:           76 bpm. Exam Location:  Inpatient Procedure: 2D Echo, Cardiac Doppler and Color Doppler Indications:    Dyspnea  History:        Patient has no prior history of Echocardiogram examinations.                 Risk Factors:Hypertension.  Sonographer:    Shirlean Kelly Referring Phys: [id number] Beatriz Stallion IMPRESSIONS  1. Left ventricular ejection fraction, by estimation, is 20 to 25%. The left ventricle has severely decreased function. The left ventricle demonstrates regional wall motion abnormalities (see scoring diagram/findings for description).  Left ventricular diastolic parameters are consistent with Grade II diastolic dysfunction (pseudonormalization). Elevated left ventricular end-diastolic pressure. There is akinesis of the left ventricular, entire inferoseptal wall, apical segment and inferior wall. There is akinesis of the left ventricular, apical anterior wall.  2. Right ventricular systolic function is normal. The right ventricular size is normal. Tricuspid regurgitation signal is inadequate for assessing PA pressure.  3. Left atrial size was mildly dilated.  4. Right  atrial size was mildly dilated.  5. The mitral valve is normal in structure. Mild mitral valve regurgitation. No evidence of mitral stenosis.  6. The aortic valve is normal in structure. Aortic valve regurgitation is not visualized. No aortic stenosis is present.  7. The inferior vena cava is normal in size with greater than 50% respiratory variability, suggesting right atrial pressure of 3 mmHg. FINDINGS  Left Ventricle: Left ventricular ejection fraction, by estimation, is 20 to 25%. The left ventricle has severely decreased function. The left ventricle demonstrates regional wall motion abnormalities. The left ventricular internal cavity size was normal  in size. There is no left ventricular hypertrophy. Abnormal (paradoxical) septal motion, consistent with left bundle branch block. Left ventricular diastolic parameters are consistent with Grade II diastolic dysfunction (pseudonormalization). Elevated left ventricular end-diastolic pressure. Right Ventricle: The right ventricular size is normal. No increase in right ventricular wall thickness. Right ventricular systolic function is normal. Tricuspid regurgitation signal is inadequate for assessing PA pressure. Left Atrium: Left atrial size was mildly dilated. Right Atrium: Right atrial size was mildly dilated. Pericardium: There is no evidence of pericardial effusion. Mitral Valve: The mitral valve is normal in structure. Mild mitral valve regurgitation. No evidence of mitral valve stenosis. Tricuspid Valve: The tricuspid valve is normal in structure. Tricuspid valve regurgitation is trivial. No evidence of tricuspid stenosis. Aortic Valve: The aortic valve is normal in structure. Aortic valve regurgitation is not visualized. No aortic stenosis is present. Aortic valve mean gradient measures 4.0 mmHg. Aortic valve peak gradient measures 7.3 mmHg. Aortic valve area, by VTI measures 1.18 cm. Pulmonic Valve: The pulmonic valve was normal in structure. Pulmonic valve  regurgitation is mild. No evidence of pulmonic stenosis. Aorta: The aortic root is normal in size and structure. Venous: The inferior vena cava is normal in size with greater than 50% respiratory variability, suggesting right atrial pressure of 3 mmHg. IAS/Shunts: No atrial level shunt detected by color flow Doppler.  LEFT VENTRICLE PLAX 2D LVIDd:         5.10 cm  Diastology LVIDs:         4.30 cm  LV e' medial:    3.90 cm/s LV PW:         1.10 cm  LV E/e' medial:  27.2 LV IVS:        1.00 cm  LV e' lateral:   7.28 cm/s LVOT diam:     1.80 cm  LV E/e' lateral: 14.6 LV SV:         31 LV SV Index:   17 LVOT Area:     2.54 cm  RIGHT VENTRICLE            IVC RV S prime:     8.18 cm/s  IVC diam: 1.70 cm TAPSE (M-mode): 2.2 cm LEFT ATRIUM             Index       RIGHT ATRIUM           Index LA diam:  3.70 cm 2.09 cm/m  RA Area:     20.10 cm LA Vol (A2C):   64.7 ml 36.59 ml/m RA Volume:   57.60 ml  32.57 ml/m LA Vol (A4C):   63.3 ml 35.79 ml/m LA Biplane Vol: 65.9 ml 37.27 ml/m  AORTIC VALVE AV Area (Vmax):    1.28 cm AV Area (Vmean):   1.24 cm AV Area (VTI):     1.18 cm AV Vmax:           135.00 cm/s AV Vmean:          89.000 cm/s AV VTI:            0.262 m AV Peak Grad:      7.3 mmHg AV Mean Grad:      4.0 mmHg LVOT Vmax:         67.90 cm/s LVOT Vmean:        43.200 cm/s LVOT VTI:          0.121 m LVOT/AV VTI ratio: 0.46  AORTA Ao Root diam: 2.50 cm Ao Asc diam:  2.80 cm MITRAL VALVE MV Area (PHT): 6.17 cm     SHUNTS MV Decel Time: 123 msec     Systemic VTI:  0.12 m MV E velocity: 106.00 cm/s  Systemic Diam: 1.80 cm MV A velocity: 62.90 cm/s MV E/A ratio:  1.69 Armanda Magic MD Electronically signed by Armanda Magic MD Signature Date/Time: 02/05/2021/10:13:29 AM    Final      Medications:     Current Medications:  aspirin  81 mg Oral Pre-Cath   enoxaparin (LOVENOX) injection  40 mg Subcutaneous Q24H   furosemide  40 mg Intravenous BID   potassium chloride  40 mEq Oral BID   sacubitril-valsartan   1 tablet Oral BID   sodium chloride flush  3 mL Intravenous Q12H   spironolactone  12.5 mg Oral Daily    Infusions:  sodium chloride     sodium chloride       Assessment/Plan   Acute Systolic Heart Failure (New): - Echo EF 20-25%, GIIDD, RV normal. + RWMAs (AK LV, entire inferoseptal wall, apical segment and inferior wall)  - Hs trop 17. No ischemic like CP - Plan [facility] tomorrow to exclude CAD - cMRI + genetic testing (? Familial CM given CHF on maternal side). Will refer to Dr. Jomarie Longs  - outpatient sleep study to r/o OSA - she is well diuresed w/ improved symptoms - continue w/ Entresto 25-26 mg bid - add spiro 12.5 mg daily  - SGLT2i next (pending Hgb A1c, in process) - Bidil soon if BP allows  2. HTN:  - controlled - c/w gradual GDMT titration   3. Hypokalemia - K 3.4 - received KCl supp - add spiro 12.5   4. Renal Insuffiencey  - baseline SCr unknown  - bump from 1.1>1.4 w/ diuresis  - monitor    Length of Stay: 0  Robbie Lis, PA-C  02/05/2021, 5:06 PM  Advanced Heart Failure Team Pager [phone] (M-F; 7a - 5p)  Please contact [facility] for night-coverage after hours (4p -7a ) and weekends on [email or web address]   Patient seen and examined with the above-signed Advanced Practice Provider and/or Housestaff. I personally reviewed laboratory data, imaging studies and relevant notes. I independently examined the patient and formulated the important aspects of the plan. I have edited the note to reflect any of my changes or salient points. I have personally discussed the plan with the patient and/or family.  50 y/o woman with extensive FHX of HF (mother and 3/6 maternal aunts including one with OHTx) admitted with acute systolic HF. Symptomatically improved with diuresis and addition of GDMT. ECG with IVCD at  General:  Well appearing. No resp difficulty HEENT: normal Neck: supple. nJVP 7 Carotids 2+ bilat; no bruits. No lymphadenopathy or thryomegaly  appreciated. Cor: PMI nondisplaced. Regular rate & rhythm. No rubs, gallops or murmurs. Lungs: clear Abdomen: soft, nontender, nondistended. No hepatosplenomegaly. No bruits or masses. Good bowel sounds. Extremities: no cyanosis, clubbing, rash, trace edema Neuro: alert & orientedx3, cranial nerves grossly intact. moves all 4 extremities w/o difficulty. 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 Symptomatically improved with diuresis and addition of GDMT. Plan cath and cMRI tomorrow. Will need genetic consultation as outpatient with Dr. Jomarie Longs.   Arvilla Meres, MD  6:24 PM

## 2021-02-06 ENCOUNTER — Inpatient Hospital Stay (HOSPITAL_COMMUNITY): Payer: BC Managed Care – PPO

## 2021-02-06 ENCOUNTER — Other Ambulatory Visit (HOSPITAL_COMMUNITY): Payer: Self-pay

## 2021-02-06 ENCOUNTER — Encounter (HOSPITAL_COMMUNITY)
Admission: EM | Disposition: A | Discharge status: Home / Self Care | Payer: Self-pay | Source: Home / Self Care | Attending: Cardiology

## 2021-02-06 ENCOUNTER — Encounter (HOSPITAL_COMMUNITY): Payer: Self-pay | Admitting: Internal Medicine

## 2021-02-06 ENCOUNTER — Telehealth (HOSPITAL_COMMUNITY): Payer: Self-pay | Admitting: Pharmacy Technician

## 2021-02-06 DIAGNOSIS — I5021 Acute systolic (congestive) heart failure: Secondary | ICD-10-CM

## 2021-02-06 HISTORY — PX: RIGHT/LEFT HEART CATH AND CORONARY ANGIOGRAPHY: CATH118266

## 2021-02-06 LAB — POCT I-STAT EG7
Acid-Base Excess: 0 mmol/L (ref 0.0–2.0)
Bicarbonate: 25.7 mmol/L (ref 20.0–28.0)
Calcium, Ion: 1.03 mmol/L — ABNORMAL LOW (ref 1.15–1.40)
HCT: 39 % (ref 36.0–46.0)
Hemoglobin: 13.3 g/dL (ref 12.0–15.0)
O2 Saturation: 71 %
Potassium: 3.5 mmol/L (ref 3.5–5.1)
Sodium: 133 mmol/L — ABNORMAL LOW (ref 135–145)
TCO2: 27 mmol/L (ref 22–32)
pCO2, Ven: 46.7 mmHg (ref 44.0–60.0)
pH, Ven: 7.349 (ref 7.250–7.430)
pO2, Ven: 39 mmHg (ref 32.0–45.0)

## 2021-02-06 LAB — BASIC METABOLIC PANEL
Anion gap: 6 (ref 5–15)
BUN: 20 mg/dL (ref 6–20)
CO2: 30 mmol/L (ref 22–32)
Calcium: 8.2 mg/dL — ABNORMAL LOW (ref 8.9–10.3)
Chloride: 103 mmol/L (ref 98–111)
Creatinine, Ser: 1.07 mg/dL — ABNORMAL HIGH (ref 0.44–1.00)
GFR, Estimated: >60 mL/min (ref >60)
Glucose, Bld: 111 mg/dL — ABNORMAL HIGH (ref 70–99)
Potassium: 3.8 mmol/L (ref 3.5–5.1)
Sodium: 139 mmol/L (ref 135–145)

## 2021-02-06 LAB — CBC
HCT: 40.7 % (ref 36.0–46.0)
Hemoglobin: 13.2 g/dL (ref 12.0–15.0)
MCH: 27 pg (ref 26.0–34.0)
MCHC: 32.4 g/dL (ref 30.0–36.0)
MCV: 83.2 fL (ref 80.0–100.0)
Platelets: 262 10*3/uL (ref 150–400)
RBC: 4.89 MIL/uL (ref 3.87–5.11)
RDW: 15.2 % (ref 11.5–15.5)
WBC: 5.3 10*3/uL (ref 4.0–10.5)
nRBC: 0 % (ref 0.0–0.2)

## 2021-02-06 LAB — POCT I-STAT 7, (LYTES, BLD GAS, ICA,H+H)
Acid-Base Excess: 3 mmol/L — ABNORMAL HIGH (ref 0.0–2.0)
Bicarbonate: 27.2 mmol/L (ref 20.0–28.0)
Calcium, Ion: 1 mmol/L — ABNORMAL LOW (ref 1.15–1.40)
HCT: 39 % (ref 36.0–46.0)
Hemoglobin: 13.3 g/dL (ref 12.0–15.0)
O2 Saturation: 98 %
Potassium: 3.6 mmol/L (ref 3.5–5.1)
Sodium: 142 mmol/L (ref 135–145)
TCO2: 28 mmol/L (ref 22–32)
pCO2 arterial: 38.9 mmHg (ref 32.0–48.0)
pH, Arterial: 7.453 — ABNORMAL HIGH (ref 7.350–7.450)
pO2, Arterial: 92 mmHg (ref 83.0–108.0)

## 2021-02-06 LAB — HEMOGLOBIN A1C
Hgb A1c MFr Bld: 6.3 % — ABNORMAL HIGH (ref 4.8–5.6)
Mean Plasma Glucose: 134 mg/dL

## 2021-02-06 LAB — MAGNESIUM: Magnesium: 1.5 mg/dL — ABNORMAL LOW (ref 1.7–2.4)

## 2021-02-06 LAB — PREGNANCY, URINE: Preg Test, Ur: NEGATIVE

## 2021-02-06 SURGERY — RIGHT/LEFT HEART CATH AND CORONARY ANGIOGRAPHY
Anesthesia: LOCAL

## 2021-02-06 MED ORDER — VERAPAMIL HCL 2.5 MG/ML IV SOLN
INTRAVENOUS | Status: AC
  Filled 2021-02-06: qty 2

## 2021-02-06 MED ORDER — HYDRALAZINE HCL 20 MG/ML IJ SOLN: 10.0000 mg | INTRAMUSCULAR | Status: AC | PRN

## 2021-02-06 MED ORDER — FENTANYL CITRATE (PF) 100 MCG/2ML IJ SOLN
INTRAMUSCULAR | Status: DC | PRN
  Administered 2021-02-06: 25 ug via INTRAVENOUS

## 2021-02-06 MED ORDER — SODIUM CHLORIDE 0.9% FLUSH: 3.0000 mL | INTRAVENOUS | Status: DC | PRN

## 2021-02-06 MED ORDER — SPIRONOLACTONE 25 MG PO TABS: 12.5000 mg | ORAL_TABLET | Freq: Every day | ORAL | 5 refills | Status: DC

## 2021-02-06 MED ORDER — FUROSEMIDE 40 MG PO TABS: 40.0000 mg | ORAL_TABLET | Freq: Every day | ORAL | 11 refills | Status: DC | PRN

## 2021-02-06 MED ORDER — HEPARIN SODIUM (PORCINE) 1000 UNIT/ML IJ SOLN
INTRAMUSCULAR | Status: AC
  Filled 2021-02-06: qty 1

## 2021-02-06 MED ORDER — HEPARIN (PORCINE) IN NACL 1000-0.9 UT/500ML-% IV SOLN
INTRAVENOUS | Status: AC
  Filled 2021-02-06: qty 1000

## 2021-02-06 MED ORDER — LIDOCAINE HCL (PF) 1 % IJ SOLN
INTRAMUSCULAR | Status: DC | PRN
  Administered 2021-02-06: 2 mL
  Administered 2021-02-06: 5 mL

## 2021-02-06 MED ORDER — ACETAMINOPHEN 325 MG PO TABS: 650.0000 mg | ORAL_TABLET | ORAL | Status: DC | PRN

## 2021-02-06 MED ORDER — MIDAZOLAM HCL 2 MG/2ML IJ SOLN
INTRAMUSCULAR | Status: DC | PRN
  Administered 2021-02-06: 2 mg via INTRAVENOUS

## 2021-02-06 MED ORDER — ONDANSETRON HCL 4 MG/2ML IJ SOLN: 4.0000 mg | Freq: Four times a day (QID) | INTRAMUSCULAR | Status: DC | PRN

## 2021-02-06 MED ORDER — HEPARIN SODIUM (PORCINE) 1000 UNIT/ML IJ SOLN
INTRAMUSCULAR | Status: DC | PRN
  Administered 2021-02-06: 4000 [IU] via INTRAVENOUS

## 2021-02-06 MED ORDER — LIDOCAINE HCL (PF) 1 % IJ SOLN
INTRAMUSCULAR | Status: AC
  Filled 2021-02-06: qty 30

## 2021-02-06 MED ORDER — VERAPAMIL HCL 2.5 MG/ML IV SOLN
INTRAVENOUS | Status: DC | PRN
  Administered 2021-02-06: 10 mL via INTRA_ARTERIAL

## 2021-02-06 MED ORDER — SODIUM CHLORIDE 0.9% FLUSH: 3.0000 mL | Freq: Two times a day (BID) | INTRAVENOUS | Status: DC

## 2021-02-06 MED ORDER — LABETALOL HCL 5 MG/ML IV SOLN: 10.0000 mg | INTRAVENOUS | Status: AC | PRN

## 2021-02-06 MED ORDER — FENTANYL CITRATE (PF) 100 MCG/2ML IJ SOLN
INTRAMUSCULAR | Status: AC
  Filled 2021-02-06: qty 2

## 2021-02-06 MED ORDER — HEPARIN (PORCINE) IN NACL 1000-0.9 UT/500ML-% IV SOLN
INTRAVENOUS | Status: DC | PRN
  Administered 2021-02-06 (×2): 500 mL

## 2021-02-06 MED ORDER — DAPAGLIFLOZIN PROPANEDIOL 10 MG PO TABS: 10.0000 mg | ORAL_TABLET | Freq: Every day | ORAL | 5 refills | Status: DC

## 2021-02-06 MED ORDER — MAGNESIUM SULFATE 4 GM/100ML IV SOLN
4.0000 g | Freq: Once | INTRAVENOUS | Status: AC
  Administered 2021-02-06: 4 g via INTRAVENOUS
  Filled 2021-02-06: qty 100

## 2021-02-06 MED ORDER — ENOXAPARIN SODIUM 40 MG/0.4ML IJ SOSY: 40.0000 mg | PREFILLED_SYRINGE | INTRAMUSCULAR | Status: DC

## 2021-02-06 MED ORDER — GADOBUTROL 1 MMOL/ML IV SOLN
8.0000 mL | Freq: Once | INTRAVENOUS | Status: AC | PRN
  Administered 2021-02-06: 8 mL via INTRAVENOUS

## 2021-02-06 MED ORDER — MIDAZOLAM HCL 2 MG/2ML IJ SOLN
INTRAMUSCULAR | Status: AC
  Filled 2021-02-06: qty 2

## 2021-02-06 MED ORDER — POTASSIUM CHLORIDE CRYS ER 20 MEQ PO TBCR: 20.0000 meq | EXTENDED_RELEASE_TABLET | Freq: Every day | ORAL | 5 refills | Status: DC | PRN

## 2021-02-06 MED ORDER — POTASSIUM CHLORIDE CRYS ER 20 MEQ PO TBCR: 40.0000 meq | EXTENDED_RELEASE_TABLET | Freq: Once | ORAL | Status: DC

## 2021-02-06 MED ORDER — SACUBITRIL-VALSARTAN 49-51 MG PO TABS: 1.0000 | ORAL_TABLET | Freq: Two times a day (BID) | ORAL | 5 refills | Status: DC

## 2021-02-06 MED ORDER — SODIUM CHLORIDE 0.9 % IV SOLN: 250.0000 mL | INTRAVENOUS | Status: DC | PRN

## 2021-02-06 MED ORDER — SACUBITRIL-VALSARTAN 49-51 MG PO TABS
1.0000 | ORAL_TABLET | Freq: Two times a day (BID) | ORAL | Status: DC
  Administered 2021-02-06: 1 via ORAL
  Filled 2021-02-06 (×2): qty 1

## 2021-02-06 MED ORDER — IOHEXOL 350 MG/ML SOLN
INTRAVENOUS | Status: DC | PRN
  Administered 2021-02-06: 20 mL

## 2021-02-06 MED ORDER — SODIUM CHLORIDE 0.9 % IV SOLN: INTRAVENOUS | Status: AC

## 2021-02-06 SURGICAL SUPPLY — 9 items
CATH 5FR JL3.5 JR4 ANG PIG MP (CATHETERS) ×2 IMPLANT
CATH BALLN WEDGE 5F 110CM (CATHETERS) ×2 IMPLANT
DEVICE RAD COMP TR BAND LRG (VASCULAR PRODUCTS) ×2 IMPLANT
GLIDESHEATH SLEND SS 6F .021 (SHEATH) ×2 IMPLANT
GUIDEWIRE INQWIRE 1.5J.035X260 (WIRE) ×1 IMPLANT
INQWIRE 1.5J .035X260CM (WIRE) ×2
PACK CARDIAC CATHETERIZATION (CUSTOM PROCEDURE TRAY) ×2 IMPLANT
SHEATH GLIDE SLENDER 4/5FR (SHEATH) ×2 IMPLANT
TRANSDUCER W/STOPCOCK (MISCELLANEOUS) ×2 IMPLANT

## 2021-02-06 NOTE — Discharge Summary (Addendum)
Advanced Heart Failure Team  Discharge Summary   Patient ID: Wendy Stephens MRN: [id number], DOB/AGE: Jan 31, 1971 50 y.o. Admit date: 02/03/2021 D/C date:     02/06/2021   Primary Discharge Diagnoses:  Acute Systolic Heart Failure/ Nonischemic CM  Hypertension    Hospital Course:   50 y/o AAF w/ h/o HTN that has been controlled w/ medications + FH of CHF on maternal side. Mother had heart failure, diagnosed in her 6550s, w/ h/o cardiac arrest and ICD implanted for secondary prevention. 4/7 maternal aunts/uncles had CHF. 1 uncle was on milrinone and followed in the [facility].    [facility]  [facility] w/ complaint of progressive LEE, exertional dyspnea and orthopnea. LEE had been present for several months, and had previously been controlled w/ HCTZ, however over the last 3 months had gradually worsened. Later switched by PCP to Lasix. Recently w/ worsening edema and dyspnea + 10 lb gain and reduced response to Lasix.   In ED, she was found to be in CHF. BP not markedly high (131/81). BNP 1,213. HS trop 17 . Denies ischemic like CP. EKG NSR w/ BAE and NSIVCD. CXR w/ cardiomegaly but no frank edema. COVD and Flu negative. HIV NR. TSH WNL. SCr 1.10. K 3.7. Social drinker, drinks 1-2 glasses of wine/week. Denies tobacco. Believes she may have had COVID Jan 2020. Has been told that she snores but has never had sleep study.  She diuresed well w/ IV Lasix. R/LHC showed normal coronaries and mildly increased left-sided filling pressures with mild mixed pulmonary HTN. cMRI completed, will follow results at clinic f/u.  GDMT initiated.   Given concern for familial CM, she will need to be referred to Dr. Jomarie LongsJoseph for genetic testing. Will also need outpatient sleep study to r/o OSA.  On 6/17, she was last seen and examined by Dr. Gala RomneyBensimhon and felt stable for d/c home.     2D Echo   1. Left ventricular ejection fraction, by estimation, is 20 to 25%. The left ventricle has severely decreased  function. The left ventricle demonstrates regional wall motion abnormalities (see scoring diagram/findings for description). Left ventricular diastolic parameters are consistent with Grade II diastolic dysfunction (pseudonormalization). Elevated left ventricular enddiastolic pressure. There is akinesis of the left ventricular, entire inferoseptal wall, apical segment and inferior wall. There is akinesis of the left ventricular, apical anterior wall. 2. Right ventricular systolic function is normal. The right ventricular size is normal. Tricuspid regurgitation signal is inadequate for assessing PA pressure. 3. Left atrial size was mildly dilated. 4. Right atrial size was mildly dilated. 5. The mitral valve is normal in structure. Mild mitral valve regurgitation. No evidence of mitral stenosis. 6. The aortic valve is normal in structure. Aortic valve regurgitation is not visualized. No aortic stenosis is present. 7. The inferior vena cava is normal in size with greater than 50% respiratory variability, suggesting right atrial pressure of 3 mmHg.   R/LHC    Ao = 117/78 (96) LV = 118/24 RA = 8 RV = 54/12 PA = 49/22 (34) PCW = 14 Fick cardiac output/index = 4.4/2.5 PVR = 4.6 Ao sat = 98% PA sat = 67%, 70%   Assessment: 1. Normal coronary arteries 2. 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 Mildly increased left-sided filling pressures with mild mixed pulmonary HTN  cMRI  Results pending   Discharge Weight Range: 163 lb  Discharge Vitals: Blood pressure 120/72, pulse 82, temperature 97.6 F (36.4 C), temperature source Oral, resp. rate 20,  height 5\' 1"  (1.549 m), weight 74.2 kg, last menstrual period 01/19/2021, SpO2 98 %.  Labs: Lab Results  Component Value Date   WBC 5.3 02/06/2021   HGB 13.6 02/06/2021   HGB 13.3 02/06/2021   HCT 40.0 02/06/2021   HCT 39.0 02/06/2021   MCV 83.2 02/06/2021   PLT 262 02/06/2021    Recent Labs  Lab 02/05/21 0036 02/06/21 0105 02/06/21 0805  02/06/21 0809  NA 141 139   < > 133*  117*  K 3.4* 3.8   < > 3.5  2.6*  CL 102 103  --   --   CO2 31 30  --   --   BUN 21* 20  --   --   CREATININE 1.35* 1.07*  --   --   CALCIUM 8.3* 8.2*  --   --   PROT 5.5*  --   --   --   BILITOT 1.8*  --   --   --   ALKPHOS 43  --   --   --   ALT 44  --   --   --   AST 38  --   --   --   GLUCOSE 115* 111*  --   --    < > = values in this interval not displayed.   No results found for: CHOL, HDL, LDLCALC, TRIG BNP (last 3 results) Recent Labs    02/03/21 2356  BNP 1,213.4*    ProBNP (last 3 results) No results for input(s): PROBNP in the last 8760 hours.   Diagnostic Studies/Procedures   CARDIAC CATHETERIZATION  Result Date: 02/06/2021 Findings: Ao = 117/78 (96) LV = 118/24 RA = 8 RV = 54/12 PA = 49/22 (34) PCW = 14 Fick cardiac output/index = 4.4/2.5 PVR = 4.6 Ao sat = 98% PA sat = 67%, 70% Assessment: 1. Normal coronary arteries 2. 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 Mildly increased left-sided filling pressures with mild mixed pulmonary HTN Plan/Discussion: cMRI today. Continue to titrate GDMT. Possibly home after MRI today or tomorrow. Arvilla Meres, MD 8:28 AM  ECHOCARDIOGRAM COMPLETE  Result Date: 02/05/2021    ECHOCARDIOGRAM REPORT   Patient Name:   Wendy Stephens Date of Exam: 02/05/2021 Medical Rec #:  [id number]        Height:       61.0 in Accession #:    [id number]       Weight:       171.3 lb Date of Birth:  1970/09/20        BSA:          1.768 m Patient Age:    49 years         BP:           115/81 mmHg Patient Gender: F                HR:           76 bpm. Exam Location:  Inpatient Procedure: 2D Echo, Cardiac Doppler and Color Doppler Indications:    Dyspnea  History:        Patient has no prior history of Echocardiogram examinations.                 Risk Factors:Hypertension.  Sonographer:    Shirlean Kelly Referring Phys: [id number] Beatriz Stallion IMPRESSIONS  1. Left ventricular ejection fraction, by estimation, is 20  to 25%. The left ventricle has severely decreased function. The left ventricle demonstrates  regional wall motion abnormalities (see scoring diagram/findings for description). Left ventricular diastolic parameters are consistent with Grade II diastolic dysfunction (pseudonormalization). Elevated left ventricular end-diastolic pressure. There is akinesis of the left ventricular, entire inferoseptal wall, apical segment and inferior wall. There is akinesis of the left ventricular, apical anterior wall.  2. Right ventricular systolic function is normal. The right ventricular size is normal. Tricuspid regurgitation signal is inadequate for assessing PA pressure.  3. Left atrial size was mildly dilated.  4. Right atrial size was mildly dilated.  5. The mitral valve is normal in structure. Mild mitral valve regurgitation. No evidence of mitral stenosis.  6. The aortic valve is normal in structure. Aortic valve regurgitation is not visualized. No aortic stenosis is present.  7. The inferior vena cava is normal in size with greater than 50% respiratory variability, suggesting right atrial pressure of 3 mmHg. FINDINGS  Left Ventricle: Left ventricular ejection fraction, by estimation, is 20 to 25%. The left ventricle has severely decreased function. The left ventricle demonstrates regional wall motion abnormalities. The left ventricular internal cavity size was normal  in size. There is no left ventricular hypertrophy. Abnormal (paradoxical) septal motion, consistent with left bundle branch block. Left ventricular diastolic parameters are consistent with Grade II diastolic dysfunction (pseudonormalization). Elevated left ventricular end-diastolic pressure. Right Ventricle: The right ventricular size is normal. No increase in right ventricular wall thickness. Right ventricular systolic function is normal. Tricuspid regurgitation signal is inadequate for assessing PA pressure. Left Atrium: Left atrial size was mildly dilated.  Right Atrium: Right atrial size was mildly dilated. Pericardium: There is no evidence of pericardial effusion. Mitral Valve: The mitral valve is normal in structure. Mild mitral valve regurgitation. No evidence of mitral valve stenosis. Tricuspid Valve: The tricuspid valve is normal in structure. Tricuspid valve regurgitation is trivial. No evidence of tricuspid stenosis. Aortic Valve: The aortic valve is normal in structure. Aortic valve regurgitation is not visualized. No aortic stenosis is present. Aortic valve mean gradient measures 4.0 mmHg. Aortic valve peak gradient measures 7.3 mmHg. Aortic valve area, by VTI measures 1.18 cm. Pulmonic Valve: The pulmonic valve was normal in structure. Pulmonic valve regurgitation is mild. No evidence of pulmonic stenosis. Aorta: The aortic root is normal in size and structure. Venous: The inferior vena cava is normal in size with greater than 50% respiratory variability, suggesting right atrial pressure of 3 mmHg. IAS/Shunts: No atrial level shunt detected by color flow Doppler.  LEFT VENTRICLE PLAX 2D LVIDd:         5.10 cm  Diastology LVIDs:         4.30 cm  LV e' medial:    3.90 cm/s LV PW:         1.10 cm  LV E/e' medial:  27.2 LV IVS:        1.00 cm  LV e' lateral:   7.28 cm/s LVOT diam:     1.80 cm  LV E/e' lateral: 14.6 LV SV:         31 LV SV Index:   17 LVOT Area:     2.54 cm  RIGHT VENTRICLE            IVC RV S prime:     8.18 cm/s  IVC diam: 1.70 cm TAPSE (M-mode): 2.2 cm LEFT ATRIUM             Index       RIGHT ATRIUM           Index  LA diam:        3.70 cm 2.09 cm/m  RA Area:     20.10 cm LA Vol (A2C):   64.7 ml 36.59 ml/m RA Volume:   57.60 ml  32.57 ml/m LA Vol (A4C):   63.3 ml 35.79 ml/m LA Biplane Vol: 65.9 ml 37.27 ml/m  AORTIC VALVE AV Area (Vmax):    1.28 cm AV Area (Vmean):   1.24 cm AV Area (VTI):     1.18 cm AV Vmax:           135.00 cm/s AV Vmean:          89.000 cm/s AV VTI:            0.262 m AV Peak Grad:      7.3 mmHg AV Mean Grad:       4.0 mmHg LVOT Vmax:         67.90 cm/s LVOT Vmean:        43.200 cm/s LVOT VTI:          0.121 m LVOT/AV VTI ratio: 0.46  AORTA Ao Root diam: 2.50 cm Ao Asc diam:  2.80 cm MITRAL VALVE MV Area (PHT): 6.17 cm     SHUNTS MV Decel Time: 123 msec     Systemic VTI:  0.12 m MV E velocity: 106.00 cm/s  Systemic Diam: 1.80 cm MV A velocity: 62.90 cm/s MV E/A ratio:  1.69 Armanda Magic MD Electronically signed by Armanda Magic MD Signature Date/Time: 02/05/2021/10:13:29 AM    Final     Discharge Medications   Allergies as of 02/06/2021   No Known Allergies      Medication List     STOP taking these medications    candesartan 32 MG tablet Commonly known as: ATACAND   sodium chloride 0.65 % Soln nasal spray Commonly known as: OCEAN       TAKE these medications    dapagliflozin propanediol 10 MG Tabs tablet Commonly known as: Farxiga Take 1 tablet (10 mg total) by mouth daily before breakfast.   furosemide 40 MG tablet Commonly known as: Lasix Take 1 tablet (40 mg total) by mouth daily as needed. For leg swelling or weight gain. Take for 3 lb wt gain in 24 hr or 5 lb in 1 week What changed:  medication strength how much to take when to take this reasons to take this additional instructions   potassium chloride SA 20 MEQ tablet Commonly known as: KLOR-CON Take 1 tablet (20 mEq total) by mouth daily as needed. Take only when you need to take a lasix tablet   sacubitril-valsartan 49-51 MG Commonly known as: ENTRESTO Take 1 tablet by mouth 2 (two) times daily.   spironolactone 25 MG tablet Commonly known as: ALDACTONE Take 0.5 tablets (12.5 mg total) by mouth daily. Start taking on: February 07, 2021        Disposition   The patient will be discharged in stable condition to home.   Follow-up Information     [facility] Follow up.   Specialty: Cardiology Why: March 04, 2021 at 9:30 AM at the Advanced Heart Failure Clinic at [facility], Entr[address] Code [phone] Contact information: [address] [id number] Wilhemina Bonito [place] [address] [phone]                  Duration of Discharge Encounter: Greater than 35 minutes   Signed, Knute Neu  02/06/2021, 5:22  PM   Agree with above. Patient ready for d/c. See rounding notes for further details.   Arvilla Meres, MD  7:30 PM

## 2021-02-06 NOTE — Progress Notes (Signed)
Advanced Heart Failure Rounding Note   Subjective:    Feeling better. Denis CP or SOB. No orthopnea or PND.     Objective:   Weight Range:  Vital Signs:   Temp:  [97.5 F (36.4 C)-97.8 F (36.6 C)] 97.6 F (36.4 C) (06/17 0511) Pulse Rate:  [80-88] 80 (06/17 0511) Resp:  [18-20] 18 (06/17 0511) BP: (101-115)/(64-81) 108/74 (06/17 0511) SpO2:  [96 %] 96 % (06/17 0511) Weight:  [74.2 kg] 74.2 kg (06/17 0511)    Weight change: Filed Weights   02/04/21 1838 02/05/21 0620 02/06/21 0511  Weight: 79 kg 77.7 kg 74.2 kg    Intake/Output:   Intake/Output Summary (Last 24 hours) at 02/06/2021 0748 Last data filed at 02/06/2021 0530 Gross per 24 hour  Intake 364.39 ml  Output 4100 ml  Net -3735.61 ml     Physical Exam: General:  Well appearing. No resp difficulty HEENT: normal Neck: supple. JVP . Carotids 2+ bilat; no bruits. No lymphadenopathy or thryomegaly appreciated. Cor: PMI nondisplaced. Regular rate & rhythm. No rubs, gallops or murmurs. Lungs: clear Abdomen: soft, nontender, nondistended. No hepatosplenomegaly. No bruits or masses. Good bowel sounds. Extremities: no cyanosis, clubbing, rash, tr edema Neuro: alert & orientedx3, cranial nerves grossly intact. moves all 4 extremities w/o difficulty. Affect pleasant  Telemetry: NSR 80-90s Personally reviewed   Labs: Basic Metabolic Panel: Recent Labs  Lab 02/03/21 2356 02/05/21 0036 02/06/21 0105  NA 137 141 139  K 3.7 3.4* 3.8  CL 103 102 103  CO2 27 31 30   GLUCOSE 107* 115* 111*  BUN 20 21* 20  CREATININE 1.10* 1.35* 1.07*  CALCIUM 8.6* 8.3* 8.2*  MG  --   --  1.5*    Liver Function Tests: Recent Labs  Lab 02/05/21 0036  AST 38  ALT 44  ALKPHOS 43  BILITOT 1.8*  PROT 5.5*  ALBUMIN 3.0*   No results for input(s): LIPASE, AMYLASE in the last 168 hours. No results for input(s): AMMONIA in the last 168 hours.  CBC: Recent Labs  Lab 02/03/21 2356 02/04/21 1918  WBC 4.8 5.3   NEUTROABS 1.8  --   HGB 12.8 13.7  HCT 39.6 41.5  MCV 83.4 82.2  PLT 233 243    Cardiac Enzymes: No results for input(s): CKTOTAL, CKMB, CKMBINDEX, TROPONINI in the last 168 hours.  BNP: BNP (last 3 results) Recent Labs    02/03/21 2356  BNP 1,213.4*    ProBNP (last 3 results) No results for input(s): PROBNP in the last 8760 hours.    Other results:  Imaging: ECHOCARDIOGRAM COMPLETE  Result Date: 02/05/2021    ECHOCARDIOGRAM REPORT   Patient Name:   Wendy Stephens Date of Exam: 02/05/2021 Medical Rec #:  02/07/2021        Height:       61.0 in Accession #:    [id number]       Weight:       171.3 lb Date of Birth:  [birth year]        BSA:          1.768 m Patient Age:    49 years         BP:           115/81 mmHg Patient Gender: F                HR:           76 bpm. Exam Location:  Inpatient Procedure: 2D Echo, Cardiac  Doppler and Color Doppler Indications:    Dyspnea  History:        Patient has no prior history of Echocardiogram examinations.                 Risk Factors:Hypertension.  Sonographer:    Shirlean Kelly Referring Phys: [id number] Beatriz Stallion IMPRESSIONS  1. Left ventricular ejection fraction, by estimation, is 20 to 25%. The left ventricle has severely decreased function. The left ventricle demonstrates regional wall motion abnormalities (see scoring diagram/findings for description). Left ventricular diastolic parameters are consistent with Grade II diastolic dysfunction (pseudonormalization). Elevated left ventricular end-diastolic pressure. There is akinesis of the left ventricular, entire inferoseptal wall, apical segment and inferior wall. There is akinesis of the left ventricular, apical anterior wall.  2. Right ventricular systolic function is normal. The right ventricular size is normal. Tricuspid regurgitation signal is inadequate for assessing PA pressure.  3. Left atrial size was mildly dilated.  4. Right atrial size was mildly dilated.  5. The mitral  valve is normal in structure. Mild mitral valve regurgitation. No evidence of mitral stenosis.  6. The aortic valve is normal in structure. Aortic valve regurgitation is not visualized. No aortic stenosis is present.  7. The inferior vena cava is normal in size with greater than 50% respiratory variability, suggesting right atrial pressure of 3 mmHg. FINDINGS  Left Ventricle: Left ventricular ejection fraction, by estimation, is 20 to 25%. The left ventricle has severely decreased function. The left ventricle demonstrates regional wall motion abnormalities. The left ventricular internal cavity size was normal  in size. There is no left ventricular hypertrophy. Abnormal (paradoxical) septal motion, consistent with left bundle branch block. Left ventricular diastolic parameters are consistent with Grade II diastolic dysfunction (pseudonormalization). Elevated left ventricular end-diastolic pressure. Right Ventricle: The right ventricular size is normal. No increase in right ventricular wall thickness. Right ventricular systolic function is normal. Tricuspid regurgitation signal is inadequate for assessing PA pressure. Left Atrium: Left atrial size was mildly dilated. Right Atrium: Right atrial size was mildly dilated. Pericardium: There is no evidence of pericardial effusion. Mitral Valve: The mitral valve is normal in structure. Mild mitral valve regurgitation. No evidence of mitral valve stenosis. Tricuspid Valve: The tricuspid valve is normal in structure. Tricuspid valve regurgitation is trivial. No evidence of tricuspid stenosis. Aortic Valve: The aortic valve is normal in structure. Aortic valve regurgitation is not visualized. No aortic stenosis is present. Aortic valve mean gradient measures 4.0 mmHg. Aortic valve peak gradient measures 7.3 mmHg. Aortic valve area, by VTI measures 1.18 cm. Pulmonic Valve: The pulmonic valve was normal in structure. Pulmonic valve regurgitation is mild. No evidence of pulmonic  stenosis. Aorta: The aortic root is normal in size and structure. Venous: The inferior vena cava is normal in size with greater than 50% respiratory variability, suggesting right atrial pressure of 3 mmHg. IAS/Shunts: No atrial level shunt detected by color flow Doppler.  LEFT VENTRICLE PLAX 2D LVIDd:         5.10 cm  Diastology LVIDs:         4.30 cm  LV e' medial:    3.90 cm/s LV PW:         1.10 cm  LV E/e' medial:  27.2 LV IVS:        1.00 cm  LV e' lateral:   7.28 cm/s LVOT diam:     1.80 cm  LV E/e' lateral: 14.6 LV SV:  31 LV SV Index:   17 LVOT Area:     2.54 cm  RIGHT VENTRICLE            IVC RV S prime:     8.18 cm/s  IVC diam: 1.70 cm TAPSE (M-mode): 2.2 cm LEFT ATRIUM             Index       RIGHT ATRIUM           Index LA diam:        3.70 cm 2.09 cm/m  RA Area:     20.10 cm LA Vol (A2C):   64.7 ml 36.59 ml/m RA Volume:   57.60 ml  32.57 ml/m LA Vol (A4C):   63.3 ml 35.79 ml/m LA Biplane Vol: 65.9 ml 37.27 ml/m  AORTIC VALVE AV Area (Vmax):    1.28 cm AV Area (Vmean):   1.24 cm AV Area (VTI):     1.18 cm AV Vmax:           135.00 cm/s AV Vmean:          89.000 cm/s AV VTI:            0.262 m AV Peak Grad:      7.3 mmHg AV Mean Grad:      4.0 mmHg LVOT Vmax:         67.90 cm/s LVOT Vmean:        43.200 cm/s LVOT VTI:          0.121 m LVOT/AV VTI ratio: 0.46  AORTA Ao Root diam: 2.50 cm Ao Asc diam:  2.80 cm MITRAL VALVE MV Area (PHT): 6.17 cm     SHUNTS MV Decel Time: 123 msec     Systemic VTI:  0.12 m MV E velocity: 106.00 cm/s  Systemic Diam: 1.80 cm MV A velocity: 62.90 cm/s MV E/A ratio:  1.69 Wendy Magic MD Electronically signed by Wendy Magic MD Signature Date/Time: 02/05/2021/10:13:29 AM    Final      Medications:     Scheduled Medications:  [MAR Hold] enoxaparin (LOVENOX) injection  40 mg Subcutaneous Q24H   [MAR Hold] furosemide  40 mg Intravenous BID   [MAR Hold] sacubitril-valsartan  1 tablet Oral BID   [MAR Hold] sodium chloride flush  3 mL Intravenous Q12H    sodium chloride flush  3 mL Intravenous Q12H   [MAR Hold] spironolactone  12.5 mg Oral Daily    Infusions:  [MAR Hold] sodium chloride     sodium chloride     sodium chloride 10 mL/hr at 02/06/21 0520   [MAR Hold] magnesium sulfate bolus IVPB      PRN Medications: [MAR Hold] sodium chloride, sodium chloride, [MAR Hold] acetaminophen, [MAR Hold] ondansetron (ZOFRAN) IV, [MAR Hold] sodium chloride flush, sodium chloride flush   Assessment/Plan:   Acute Systolic Heart Failure (New): - Echo EF 20-25%, GIIDD, RV normal. + RWMAs (AK LV, entire inferoseptal wall, apical segment and inferior wall) - Hs trop 17. No ischemic like CP - Plan R/LHC this am to exclude CAD - cMRI + genetic testing (? Familial CM given CHF on maternal side). Will refer to Dr. 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 FHX of HF (mother and 3/6 maternal aunts including one  with OHTx). Symptomatically improved with diuresis and addition of GDMT. Plan cath and cMRI today.  Will need genetic consultation as outpatient with Dr. Jomarie Longs.    2. HTN: - controlled - c/w gradual GDMT titration   3. Hypokalemia/hypomag - K 3.8 - Mag supped    4. Renal Insuffiencey - baseline SCr unknown - bump from 1.1>1.4 w/ diuresis - SCr 1.07 today      Length of Stay: 1   Arvilla Meres MD 02/06/2021, 7:48 AM  Advanced Heart Failure Team Pager [phone] (M-F; 7a - 4p)  Please contact [facility] for night-coverage after hours (4p -7a ) and weekends on [email or web address]

## 2021-02-06 NOTE — H&P (View-Only) (Signed)
Advanced Heart Failure Rounding Note   Subjective:    Feeling better. Denis CP or SOB. No orthopnea or PND.     Objective:   Weight Range:  Vital Signs:   Temp:  [97.5 F (36.4 C)-97.8 F (36.6 C)] 97.6 F (36.4 C) (06/17 0511) Pulse Rate:  [80-88] 80 (06/17 0511) Resp:  [18-20] 18 (06/17 0511) BP: (101-115)/(64-81) 108/74 (06/17 0511) SpO2:  [96 %] 96 % (06/17 0511) Weight:  [74.2 kg] 74.2 kg (06/17 0511)    Weight change: Filed Weights   02/04/21 1838 02/05/21 0620 02/06/21 0511  Weight: 79 kg 77.7 kg 74.2 kg    Intake/Output:   Intake/Output Summary (Last 24 hours) at 02/06/2021 0748 Last data filed at 02/06/2021 0530 Gross per 24 hour  Intake 364.39 ml  Output 4100 ml  Net -3735.61 ml     Physical Exam: General:  Well appearing. No resp difficulty HEENT: normal Neck: supple. JVP . Carotids 2+ bilat; no bruits. No lymphadenopathy or thryomegaly appreciated. Cor: PMI nondisplaced. Regular rate & rhythm. No rubs, gallops or murmurs. Lungs: clear Abdomen: soft, nontender, nondistended. No hepatosplenomegaly. No bruits or masses. Good bowel sounds. Extremities: no cyanosis, clubbing, rash, tr edema Neuro: alert & orientedx3, cranial nerves grossly intact. moves all 4 extremities w/o difficulty. Affect pleasant  Telemetry: NSR 80-90s Personally reviewed   Labs: Basic Metabolic Panel: Recent Labs  Lab 02/03/21 2356 02/05/21 0036 02/06/21 0105  NA 137 141 139  K 3.7 3.4* 3.8  CL 103 102 103  CO2 27 31 30   GLUCOSE 107* 115* 111*  BUN 20 21* 20  CREATININE 1.10* 1.35* 1.07*  CALCIUM 8.6* 8.3* 8.2*  MG  --   --  1.5*    Liver Function Tests: Recent Labs  Lab 02/05/21 0036  AST 38  ALT 44  ALKPHOS 43  BILITOT 1.8*  PROT 5.5*  ALBUMIN 3.0*   No results for input(s): LIPASE, AMYLASE in the last 168 hours. No results for input(s): AMMONIA in the last 168 hours.  CBC: Recent Labs  Lab 02/03/21 2356 02/04/21 1918  WBC 4.8 5.3   NEUTROABS 1.8  --   HGB 12.8 13.7  HCT 39.6 41.5  MCV 83.4 82.2  PLT 233 243    Cardiac Enzymes: No results for input(s): CKTOTAL, CKMB, CKMBINDEX, TROPONINI in the last 168 hours.  BNP: BNP (last 3 results) Recent Labs    02/03/21 2356  BNP 1,213.4*    ProBNP (last 3 results) No results for input(s): PROBNP in the last 8760 hours.    Other results:  Imaging: ECHOCARDIOGRAM COMPLETE  Result Date: 02/05/2021    ECHOCARDIOGRAM REPORT   Patient Name:   Wendy Stephens Date of Exam: 02/05/2021 Medical Rec #:  02/07/2021        Height:       61.0 in Accession #:    [id number]       Weight:       171.3 lb Date of Birth:  [birth year]        BSA:          1.768 m Patient Age:    49 years         BP:           115/81 mmHg Patient Gender: F                HR:           76 bpm. Exam Location:  Inpatient Procedure: 2D Echo, Cardiac  Doppler and Color Doppler Indications:    Dyspnea  History:        Patient has no prior history of Echocardiogram examinations.                 Risk Factors:Hypertension.  Sonographer:    Shirlean Kelly Referring Phys: [id number] Beatriz Stallion IMPRESSIONS  1. Left ventricular ejection fraction, by estimation, is 20 to 25%. The left ventricle has severely decreased function. The left ventricle demonstrates regional wall motion abnormalities (see scoring diagram/findings for description). Left ventricular diastolic parameters are consistent with Grade II diastolic dysfunction (pseudonormalization). Elevated left ventricular end-diastolic pressure. There is akinesis of the left ventricular, entire inferoseptal wall, apical segment and inferior wall. There is akinesis of the left ventricular, apical anterior wall.  2. Right ventricular systolic function is normal. The right ventricular size is normal. Tricuspid regurgitation signal is inadequate for assessing PA pressure.  3. Left atrial size was mildly dilated.  4. Right atrial size was mildly dilated.  5. The mitral  valve is normal in structure. Mild mitral valve regurgitation. No evidence of mitral stenosis.  6. The aortic valve is normal in structure. Aortic valve regurgitation is not visualized. No aortic stenosis is present.  7. The inferior vena cava is normal in size with greater than 50% respiratory variability, suggesting right atrial pressure of 3 mmHg. FINDINGS  Left Ventricle: Left ventricular ejection fraction, by estimation, is 20 to 25%. The left ventricle has severely decreased function. The left ventricle demonstrates regional wall motion abnormalities. The left ventricular internal cavity size was normal  in size. There is no left ventricular hypertrophy. Abnormal (paradoxical) septal motion, consistent with left bundle branch block. Left ventricular diastolic parameters are consistent with Grade II diastolic dysfunction (pseudonormalization). Elevated left ventricular end-diastolic pressure. Right Ventricle: The right ventricular size is normal. No increase in right ventricular wall thickness. Right ventricular systolic function is normal. Tricuspid regurgitation signal is inadequate for assessing PA pressure. Left Atrium: Left atrial size was mildly dilated. Right Atrium: Right atrial size was mildly dilated. Pericardium: There is no evidence of pericardial effusion. Mitral Valve: The mitral valve is normal in structure. Mild mitral valve regurgitation. No evidence of mitral valve stenosis. Tricuspid Valve: The tricuspid valve is normal in structure. Tricuspid valve regurgitation is trivial. No evidence of tricuspid stenosis. Aortic Valve: The aortic valve is normal in structure. Aortic valve regurgitation is not visualized. No aortic stenosis is present. Aortic valve mean gradient measures 4.0 mmHg. Aortic valve peak gradient measures 7.3 mmHg. Aortic valve area, by VTI measures 1.18 cm. Pulmonic Valve: The pulmonic valve was normal in structure. Pulmonic valve regurgitation is mild. No evidence of pulmonic  stenosis. Aorta: The aortic root is normal in size and structure. Venous: The inferior vena cava is normal in size with greater than 50% respiratory variability, suggesting right atrial pressure of 3 mmHg. IAS/Shunts: No atrial level shunt detected by color flow Doppler.  LEFT VENTRICLE PLAX 2D LVIDd:         5.10 cm  Diastology LVIDs:         4.30 cm  LV e' medial:    3.90 cm/s LV PW:         1.10 cm  LV E/e' medial:  27.2 LV IVS:        1.00 cm  LV e' lateral:   7.28 cm/s LVOT diam:     1.80 cm  LV E/e' lateral: 14.6 LV SV:  31 LV SV Index:   17 LVOT Area:     2.54 cm  RIGHT VENTRICLE            IVC RV S prime:     8.18 cm/s  IVC diam: 1.70 cm TAPSE (M-mode): 2.2 cm LEFT ATRIUM             Index       RIGHT ATRIUM           Index LA diam:        3.70 cm 2.09 cm/m  RA Area:     20.10 cm LA Vol (A2C):   64.7 ml 36.59 ml/m RA Volume:   57.60 ml  32.57 ml/m LA Vol (A4C):   63.3 ml 35.79 ml/m LA Biplane Vol: 65.9 ml 37.27 ml/m  AORTIC VALVE AV Area (Vmax):    1.28 cm AV Area (Vmean):   1.24 cm AV Area (VTI):     1.18 cm AV Vmax:           135.00 cm/s AV Vmean:          89.000 cm/s AV VTI:            0.262 m AV Peak Grad:      7.3 mmHg AV Mean Grad:      4.0 mmHg LVOT Vmax:         67.90 cm/s LVOT Vmean:        43.200 cm/s LVOT VTI:          0.121 m LVOT/AV VTI ratio: 0.46  AORTA Ao Root diam: 2.50 cm Ao Asc diam:  2.80 cm MITRAL VALVE MV Area (PHT): 6.17 cm     SHUNTS MV Decel Time: 123 msec     Systemic VTI:  0.12 m MV E velocity: 106.00 cm/s  Systemic Diam: 1.80 cm MV A velocity: 62.90 cm/s MV E/A ratio:  1.69 Traci Turner MD Electronically signed by Traci Turner MD Signature Date/Time: 02/05/2021/10:13:29 AM    Final      Medications:     Scheduled Medications:  [MAR Hold] enoxaparin (LOVENOX) injection  40 mg Subcutaneous Q24H   [MAR Hold] furosemide  40 mg Intravenous BID   [MAR Hold] sacubitril-valsartan  1 tablet Oral BID   [MAR Hold] sodium chloride flush  3 mL Intravenous Q12H    sodium chloride flush  3 mL Intravenous Q12H   [MAR Hold] spironolactone  12.5 mg Oral Daily    Infusions:  [MAR Hold] sodium chloride     sodium chloride     sodium chloride 10 mL/hr at 02/06/21 0520   [MAR Hold] magnesium sulfate bolus IVPB      PRN Medications: [MAR Hold] sodium chloride, sodium chloride, [MAR Hold] acetaminophen, [MAR Hold] ondansetron (ZOFRAN) IV, [MAR Hold] sodium chloride flush, sodium chloride flush   Assessment/Plan:   Acute Systolic Heart Failure (New): - Echo EF 20-25%, GIIDD, RV normal. + RWMAs (AK LV, entire inferoseptal wall, apical segment and inferior wall) - Hs trop 17. No ischemic like CP - Plan R/LHC this am to exclude CAD - cMRI + genetic testing (? Familial CM given CHF on maternal side). Will refer to Dr. Joseph  - outpatient sleep study to r/o OSA - she is well diuresed w/ improved symptoms - Increase Entresto to 49/51 mg bid after cath - continue spiro 12.5 mg daily - SGLT2i next (pending Hgb A1c, in process) - Suspect she has familial CM. FHX of HF (mother and 3/6 maternal aunts including one   with OHTx). Symptomatically improved with diuresis and addition of GDMT. Plan cath and cMRI today.  Will need genetic consultation as outpatient with Dr. Jomarie Longs.    2. HTN: - controlled - c/w gradual GDMT titration   3. Hypokalemia/hypomag - K 3.8 - Mag supped    4. Renal Insuffiencey - baseline SCr unknown - bump from 1.1>1.4 w/ diuresis - SCr 1.07 today      Length of Stay: 1   Arvilla Meres MD 02/06/2021, 7:48 AM  Advanced Heart Failure Team Pager [phone] (M-F; 7a - 4p)  Please contact [facility] for night-coverage after hours (4p -7a ) and weekends on [email or web address]

## 2021-02-06 NOTE — Plan of Care (Signed)

## 2021-02-06 NOTE — Telephone Encounter (Signed)
Advanced Heart Failure Patient Advocate Encounter  Prior Authorization for Marcelline Deist has been approved.    PA# [id number] Effective dates: 02/06/21 through 02/06/22  Patients co-pay is $15 (30 days) patient can also use a co-pay card.   Archer Asa, CPhT

## 2021-02-06 NOTE — Progress Notes (Signed)
CSW spoke with the patient at bedside to bring them an appointment card for the [facility] outpatient clinic and encouraged them to follow up and to attend the appointment and bring their medications and if anything changes to please reach out so that CSW/HV clinic team can provide support.   CSW will sign off for now as social work intervention is no longer needed. Please consult [place] again if new needs arise.

## 2021-02-06 NOTE — Progress Notes (Signed)
Mobility Specialist: Progress Note   02/06/21 1234  Mobility  Activity Ambulated in hall  Level of Assistance Contact guard assist, steadying assist  Assistive Device None  Distance Ambulated (ft) 800 ft  Mobility Ambulated with assistance in hallway  Mobility Response Tolerated well  Mobility performed by Mobility specialist  $Mobility charge 1 Mobility   Pre-Mobility: 84 HR, 97% SpO2 Post-Mobility: 85 HR, 98% SpO2  Pt c/o feeling a little dizzy after sitting EOB but otherwise asx. Pt to BR and then agreeable to ambulate, void successful. Pt was contact guard with gait belt as a precaution d/t her dizziness. Pt said her dizziness had went away after returning to the room. Pt back in bed with call bell at her side.   [facility] Tangela Dolliver Mobility Specialist Mobility Specialist Phone: [phone]

## 2021-02-06 NOTE — Interval H&P Note (Signed)
History and Physical Interval Note:  02/06/2021 7:52 AM  Wendy Stephens  has presented today for surgery, with the diagnosis of heart failure.  The various methods of treatment have been discussed with the patient and family. After consideration of risks, benefits and other options for treatment, the patient has consented to  Procedure(s): RIGHT/LEFT HEART CATH AND CORONARY ANGIOGRAPHY (N/A) and possible coronary angiography as a surgical intervention.  The patient's history has been reviewed, patient examined, no change in status, stable for surgery.  I have reviewed the patient's chart and labs.  Questions were answered to the patient's satisfaction.     Salayah Meares

## 2021-02-09 ENCOUNTER — Encounter (HOSPITAL_COMMUNITY): Payer: Self-pay | Admitting: *Deleted

## 2021-02-10 ENCOUNTER — Other Ambulatory Visit (HOSPITAL_COMMUNITY): Payer: Self-pay | Admitting: *Deleted

## 2021-02-10 DIAGNOSIS — Z8249 Family history of ischemic heart disease and other diseases of the circulatory system: Secondary | ICD-10-CM

## 2021-02-10 LAB — POCT I-STAT EG7
Acid-base deficit: 9 mmol/L — ABNORMAL HIGH (ref 0.0–2.0)
Bicarbonate: 18.5 mmol/L — ABNORMAL LOW (ref 20.0–28.0)
Calcium, Ion: 0.75 mmol/L — CL (ref 1.15–1.40)
HCT: 40 % (ref 36.0–46.0)
Hemoglobin: 13.6 g/dL (ref 12.0–15.0)
O2 Saturation: 67 %
Potassium: 2.6 mmol/L — CL (ref 3.5–5.1)
Sodium: 117 mmol/L — CL (ref 135–145)
TCO2: 20 mmol/L — ABNORMAL LOW (ref 22–32)
pCO2, Ven: 45.4 mmHg (ref 44.0–60.0)
pH, Ven: 7.217 — ABNORMAL LOW (ref 7.250–7.430)
pO2, Ven: 42 mmHg (ref 32.0–45.0)

## 2021-02-18 ENCOUNTER — Encounter (HOSPITAL_COMMUNITY): Payer: Self-pay | Admitting: *Deleted

## 2021-02-18 NOTE — Progress Notes (Signed)
Pt's disability forms completed, signed by Dr Bensimhon, and faxed to [employer] at [phone] °

## 2021-02-24 ENCOUNTER — Telehealth (HOSPITAL_COMMUNITY): Payer: Self-pay | Admitting: *Deleted

## 2021-02-24 NOTE — Telephone Encounter (Signed)
Pt left vm inquiring about disability forms. I called her back and left detailed vm that forms were completed,signed, and faxed on 6/29 per Dinah Beers.

## 2021-03-03 NOTE — Progress Notes (Signed)
Advanced Heart Failure Clinic Note   PCP: System, Provider Not In PCP-Cardiologist: Chilton Si, MD  HF Cardiologist: Dr. Gala Romney  HPI: 50 y/o AAF w/ h/o HTN and new systolic heart failure. She has a strong FH of CHF on maternal side. Mother had heart failure, diagnosed in her 73s, w/ h/o cardiac arrest and ICD implanted for secondary prevention. 4/7 maternal aunts/uncles had CHF. 1 uncle was on milrinone and followed in the [facility].    She presented to the [facility] on 02/04/21 w/ complaint of progressive LEE, exertional dyspnea and orthopnea. LEE worsened over the last 3 months, recalcitrant to HCTZ, PCP switched to lasix. Recently w/ worseningdyspnea + 10 lb gain and reduced response to Lasix.   In ED, she was found to be in CHF. She diuresed well w/ IV Lasix. R/LHC showed normal coronaries and mildly increased left-sided filling pressures with mild mixed pulmonary HTN. cMRI completed, will follow results at clinic f/u.  GDMT initiated.   Today she returns for post hospitalization HF follow up. Overall feeling fine, better since leaving hospitalization. Some atypical CP/soreness. Denies increasing SOB, CP, dizziness, edema, or PND/Orthopnea. Appetite ok. No fever or chills. Weight at home 160 pounds. Taking all medications. Wants to start exercising and return to work.  Cardiac Studies:   - Echo (6/22): EF 20-25%, LV severely depressed, RV ok  - L/RHC (6/22): Normal cors and mildly increased left-sided filling pressures with mild mixed pulmonary HTN. Ao = 117/78 (96) LV = 118/24 RA = 8 RV = 54/12 PA = 49/22 (34) PCW = 14 Fick cardiac output/index = 4.4/2.5 PVR = 4.6 Ao sat = 98% PA sat = 67%, 70%  - cMRI (6/22): EF 23%, LGE in the left ventricular myocardium: Mid myocardial in the basal anteroseptal and inferoseptal. Mild RV systolic dysfunction (RVEF =39%).   IMPRESSION: Severely reduced LVEF, given breath hold artifact during LGE assessment sensitivity for LGE is reduced (in  some PSIR views, this appears to be a septal perforator vessel).   Review of Systems: [y] = yes, [ ]  = no   General: Weight gain [ ] ; Weight loss [ ] ; Anorexia [ ] ; Fatigue [ ] ; Fever [ ] ; Chills [ ] ; Weakness [ ]   Cardiac: Chest pain/pressure [ y]; Resting SOB [ ] ; Exertional SOB ]; Orthopnea [ ] ; Pedal Edema ]; Palpitations [ ] ; Syncope [ ] ; Presyncope [ ] ; Paroxysmal nocturnal dyspnea[ ]   Pulmonary: Cough [ ] ; Wheezing[ ] ; Hemoptysis[ ] ; Sputum [ ] ; Snoring [ y]  GI: Vomiting[ ] ; Dysphagia[ ] ; Melena[ ] ; Hematochezia [ ] ; Heartburn[ ] ; Abdominal pain [ ] ; Constipation [ ] ; Diarrhea [ ] ; BRBPR [ ]   GU: Hematuria[ ] ; Dysuria [ ] ; Nocturia[ ]   Vascular: Pain in legs with walking [ ] ; Pain in feet with lying flat [ ] ; Non-healing sores [ ] ; Stroke [ ] ; TIA [ ] ; Slurred speech [ ] ;  Neuro: Headaches[ ] ; Vertigo[ ] ; Seizures[ ] ; Paresthesias[ ] ;Blurred vision [ ] ; Diplopia [ ] ; Vision changes [ ]   Ortho/Skin: Arthritis [ ] ; Joint pain [ ] ; Muscle pain [ ] ; Joint swelling [ ] ; Back Pain [ ] ; Rash [ ]   Psych: Depression[ ] ; Anxiety[ ]   Heme: Bleeding problems [ ] ; Clotting disorders [ ] ; Anemia [ ]   Endocrine: Diabetes [ ] ; Thyroid dysfunction[ ]   Past Medical History:  Diagnosis Date   Back pain    Chest pain, unspecified    Hypertension    Palpitations    Current Outpatient Medications  Medication Sig  Dispense Refill   dapagliflozin propanediol (FARXIGA) 10 MG TABS tablet Take 1 tablet (10 mg total) by mouth daily before breakfast. 30 tablet 5   furosemide (LASIX) 40 MG tablet Take 1 tablet (40 mg total) by mouth daily as needed. For leg swelling or weight gain. Take for 3 lb wt gain in 24 hr or 5 lb in 1 week 30 tablet 11   potassium chloride SA (KLOR-CON) 20 MEQ tablet Take 1 tablet (20 mEq total) by mouth daily as needed. Take only when you need to take a lasix tablet 30 tablet 5   sacubitril-valsartan (ENTRESTO) 49-51 MG Take 1 tablet by mouth 2 (two) times daily. 60 tablet 5    spironolactone (ALDACTONE) 25 MG tablet Take 0.5 tablets (12.5 mg total) by mouth daily. 30 tablet 5   No current facility-administered medications for this encounter.   No Known Allergies  Social History   Socioeconomic History   Marital status: Single    Spouse name: Not on file   Number of children: Not on file   Years of education: Not on file   Highest education level: Not on file  Occupational History   Not on file  Tobacco Use   Smoking status: Never   Smokeless tobacco: Not on file  Substance and Sexual Activity   Alcohol use: Yes    Comment: 1-2 per week   Drug use: No   Sexual activity: Not on file  Other Topics Concern   Not on file  Social History Narrative   Not on file   Social Determinants of Health   Financial Resource Strain: Not on file  Food Insecurity: Not on file  Transportation Needs: Not on file  Physical Activity: Not on file  Stress: Not on file  Social Connections: Not on file  Intimate Partner Violence: Not on file   Family History  Problem Relation Age of Onset   Hypertension Mother    CAD Mother    Prostate cancer Father    Heart failure Maternal Aunt    Heart failure Maternal Aunt    Heart failure Maternal Aunt    Heart failure Maternal Uncle    Anemia Neg Hx    Arrhythmia Neg Hx    Asthma Neg Hx    Clotting disorder Neg Hx    Fainting Neg Hx    Hyperlipidemia Neg Hx    BP 114/76   Pulse 65   Wt 72.8 kg (160 lb 6.4 oz)   SpO2 98%   BMI 30.31 kg/m   Wt Readings from Last 3 Encounters:  03/04/21 72.8 kg (160 lb 6.4 oz)  02/06/21 74.2 kg (163 lb 9.3 oz)   PHYSICAL EXAM: General:  NAD. No resp difficulty HEENT: Normal Neck: Supple. No JVD. Carotids 2+ bilat; no bruits. No lymphadenopathy or thryomegaly appreciated. Cor: PMI nondisplaced. Regular rate & rhythm. No rubs, gallops or murmurs. Lungs: Clear Abdomen: Soft, nontender, nondistended. No hepatosplenomegaly. No bruits or masses. Good bowel sounds. Extremities: No  cyanosis, clubbing, rash, edema Neuro: Alert & oriented x 3, cranial nerves grossly intact. Moves all 4 extremities w/o difficulty. Affect pleasant.  ECG: SR IVCD 146 ms (personally reviewed)  ASSESSMENT & PLAN: Chronic systolic Heart Failure (New): - Echo EF 20-25%, GIIDD, RV normal. + RWMAs (AK LV, entire inferoseptal wall, apical segment and inferior wall) - R/LHC (6/22): normal cors and mildly increased left-sided filling pressures with mild mixed pulmonary HTN. - cMRI (6/22):  - cMRI + genetic testing (? Familial CM given  CHF on maternal side). Will refer to Dr. Jomarie Longs.  - NYHA II, volume ok today. - Start carvedilol 3.125 mg bid. - Continue Farxiga 10 mg daily. - Continue Entresto 49/51 mg bid. - Continue spiro 12.5 mg daily. - Suspect she has familial CM. FHX of HF (mother and 3/6 maternal aunts including one with OHTx). Symptomatically improved with diuresis and addition of GDMT.  Will refer to Dr. Jomarie Longs for genetic consultation.. - BMET today.   2. HTN: - Controlled. - Continue with gradual GDMT titration. - Arrange sleep study.  3. Snoring - She says she snores. - Sleep study.   4. Renal Insuffiencey: - Baseline SCr unknown - Bump from 1.1>1.4 w/ diuresis. - BMET today.  5. Obesity: - Body mass index is 30.31 kg/m. - Discussed at length appropriate exercises she can do now - Keep HR under 135. - Discussed dietary changes to aid in weight loss  6. Pre-Diabetes: - A1c 6.4. On Farxiga. - Information given on diet/exercise  - Greater than 50% of the (total minutes 45) visit spent in extensive counseling/coordination of care regarding ( details of discussion heart failure diagnosis, etiology of HF, medications/side effects, and dietary and exercise considerations.)   - Follow up in 3 weeks with APP for further GDMT titration.   - Will discuss return to work then. She also has a conference in [place] 1st week in August she would like to go to.    Jacklynn Ganong,  [place] 03/04/21

## 2021-03-04 ENCOUNTER — Other Ambulatory Visit: Payer: Self-pay

## 2021-03-04 ENCOUNTER — Encounter (HOSPITAL_COMMUNITY): Payer: Self-pay

## 2021-03-04 ENCOUNTER — Ambulatory Visit (HOSPITAL_COMMUNITY)
Admit: 2021-03-04 | Discharge: 2021-03-04 | Disposition: A | Discharge status: Home / Self Care | Payer: BC Managed Care – PPO | Source: Ambulatory Visit | Attending: Family Medicine | Admitting: Family Medicine

## 2021-03-04 ENCOUNTER — Other Ambulatory Visit (HOSPITAL_COMMUNITY): Payer: Self-pay

## 2021-03-04 VITALS — BP 114/76 | HR 65 | Wt 160.4 lb

## 2021-03-04 DIAGNOSIS — Z79899 Other long term (current) drug therapy: Secondary | ICD-10-CM | POA: Insufficient documentation

## 2021-03-04 DIAGNOSIS — R7303 Prediabetes: Secondary | ICD-10-CM | POA: Insufficient documentation

## 2021-03-04 DIAGNOSIS — I5022 Chronic systolic (congestive) heart failure: Secondary | ICD-10-CM | POA: Diagnosis not present

## 2021-03-04 DIAGNOSIS — R0683 Snoring: Secondary | ICD-10-CM | POA: Diagnosis not present

## 2021-03-04 DIAGNOSIS — I110 Hypertensive heart disease with heart failure: Secondary | ICD-10-CM | POA: Insufficient documentation

## 2021-03-04 DIAGNOSIS — N289 Disorder of kidney and ureter, unspecified: Secondary | ICD-10-CM | POA: Insufficient documentation

## 2021-03-04 DIAGNOSIS — Z7984 Long term (current) use of oral hypoglycemic drugs: Secondary | ICD-10-CM | POA: Insufficient documentation

## 2021-03-04 DIAGNOSIS — E669 Obesity, unspecified: Secondary | ICD-10-CM | POA: Insufficient documentation

## 2021-03-04 DIAGNOSIS — R0789 Other chest pain: Secondary | ICD-10-CM | POA: Insufficient documentation

## 2021-03-04 DIAGNOSIS — Z8249 Family history of ischemic heart disease and other diseases of the circulatory system: Secondary | ICD-10-CM | POA: Insufficient documentation

## 2021-03-04 DIAGNOSIS — Z6830 Body mass index (BMI) 30.0-30.9, adult: Secondary | ICD-10-CM | POA: Insufficient documentation

## 2021-03-04 LAB — BASIC METABOLIC PANEL
Anion gap: 7 (ref 5–15)
BUN: 19 mg/dL (ref 6–20)
CO2: 26 mmol/L (ref 22–32)
Calcium: 9.3 mg/dL (ref 8.9–10.3)
Chloride: 104 mmol/L (ref 98–111)
Creatinine, Ser: 0.96 mg/dL (ref 0.44–1.00)
GFR, Estimated: >60 mL/min (ref >60)
Glucose, Bld: 85 mg/dL (ref 70–99)
Potassium: 4.5 mmol/L (ref 3.5–5.1)
Sodium: 137 mmol/L (ref 135–145)

## 2021-03-04 LAB — BRAIN NATRIURETIC PEPTIDE: B Natriuretic Peptide: 722.2 pg/mL — ABNORMAL HIGH (ref 0.0–100.0)

## 2021-03-04 MED ORDER — CARVEDILOL 3.125 MG PO TABS: 3.1250 mg | ORAL_TABLET | Freq: Two times a day (BID) | ORAL | 11 refills | Status: DC

## 2021-03-04 NOTE — Progress Notes (Signed)
ITAMAR home sleep study given to patient, all instructions explained, and CLOUDPAT registration complete.  

## 2021-03-04 NOTE — Patient Instructions (Addendum)
  START Coreg 3.125 mg, one tab twice a day Labs today We will only contact you if something comes back abnormal or we need to make some changes. Otherwise no news is good news!  You have been referred to Genetics Testing with Dr Jomarie Longs  Your physician recommends that you schedule a follow-up appointment in: 3 weeks with the pharmacy team and in 6 weeks  in the Advanced Practitioners (PA/NP) Clinic  Do the following things EVERYDAY: Weigh yourself in the morning before breakfast. Write it down and keep it in a log. Take your medicines as prescribed Eat low salt foods--Limit salt (sodium) to 2000 mg per day.  Stay as active as you can everyday Limit all fluids for the day to less than 2 liters milAt the Advanced Heart Failure Clinic, you and your health needs are our priority. As part of our continuing mission to provide you with exceptional heart care, we have created designated Provider Care Teams. These Care Teams include your primary Cardiologist (physician) and Advanced Practice Providers (APPs- Physician Assistants and Nurse Practitioners) who all work together to provide you with the care you need, when you need it.   You may see any of the following providers on your designated Care Team at your next follow up: Dr Arvilla Meres Dr Marca Ancona Dr Brandon Melnick, NP Robbie Lis, [place] Mikki Santee Karle Plumber, PharmD   Please be sure to bring in all your medications bottles to every appointment.

## 2021-03-05 ENCOUNTER — Ambulatory Visit (HOSPITAL_BASED_OUTPATIENT_CLINIC_OR_DEPARTMENT_OTHER): Payer: BC Managed Care – PPO | Admitting: Cardiovascular Disease

## 2021-03-05 ENCOUNTER — Encounter (HOSPITAL_COMMUNITY): Payer: Self-pay

## 2021-03-05 ENCOUNTER — Encounter (HOSPITAL_BASED_OUTPATIENT_CLINIC_OR_DEPARTMENT_OTHER): Payer: Self-pay | Admitting: Cardiovascular Disease

## 2021-03-05 DIAGNOSIS — I10 Essential (primary) hypertension: Secondary | ICD-10-CM | POA: Diagnosis not present

## 2021-03-05 DIAGNOSIS — I5042 Chronic combined systolic (congestive) and diastolic (congestive) heart failure: Secondary | ICD-10-CM | POA: Diagnosis not present

## 2021-03-05 HISTORY — DX: Essential (primary) hypertension: I10

## 2021-03-05 NOTE — Assessment & Plan Note (Signed)
BP well-controlled on current regimen.  

## 2021-03-05 NOTE — Progress Notes (Signed)
Cardiology Office Note:    Date:  03/05/2021   ID:  Wendy Stephens, DOB December 03, 1970, MRN [id number]  PCP:  Linward Foster, MD   [facility] Providers Cardiologist:  Chilton Si, MD Advanced Heart Failure:  Arvilla Meres, MD 35    Referring MD: Wendall Papa, NP   No chief complaint on file.   History of Present Illness:    Wendy Stephens is a 50 y.o. female with a hx of chronic systolic and diastolic heart failure and hypertension here for follow-up. She was seen in the ED 02/03/2021 and found to be in CHF. Echo showed LVEF 20-25% with grade 2 diastolic dysfunction. She has focal wall motion abnormalities, however, R/LHC showed normal coronaries and mildly increased left-sided filling pressures with mild mixed pulmonary HTN. She diuresed well w/ IV Lasix. Her mother was dx with heart failure in her 76s and had cardiac arrest. She had an ICD implanted. 4/7 of her maternal aunts and uncles had heart failure, one of whom is on milrinone and follows at our advanced heart failure clinic. She was discharged on farxiga, lasix, entresto, and spironolactone. She was seen in advanced heart failure clinic yesterday.   Today, she is accompanied by her maternal Aunt. She is feeling better today. She first noticed more frequent edema around 10/2020. After trying several medications her edema was not improving. Sometimes she was unable to sleep through the night due to cough. She states the fluid progressed to her mid-thigh and then she went to [facility]. At home, she notes her blood pressure is the lowest it has ever been since before her ED visit. Occasionally she does feel sluggish. Yesterday she was started on carvedilol. On her current regimen, she is not having the same abdominal pains she had previously. She denies any chest pain, shortness of breath, palpitations, or exertional symptoms. No headaches, lightheadedness, or syncope to report. Also has no orthopnea or  PND.   Past Medical History:  Diagnosis Date   Back pain    Chest pain, unspecified    Chronic combined systolic and diastolic heart failure (HCC) 02/05/2021   Essential hypertension 03/05/2021   Hypertension    Palpitations     Past Surgical History:  Procedure Laterality Date   MYOMECTOMY  2010   RIGHT/LEFT HEART CATH AND CORONARY ANGIOGRAPHY N/A 02/06/2021   Procedure: RIGHT/LEFT HEART CATH AND CORONARY ANGIOGRAPHY;  Surgeon: Dolores Patty, MD;  Location: [facility] INVASIVE CV LAB;  Service: Cardiovascular;  Laterality: N/A;    Current Medications: Current Meds  Medication Sig   carvedilol (COREG) 3.125 MG tablet Take 1 tablet (3.125 mg total) by mouth 2 (two) times daily.   dapagliflozin propanediol (FARXIGA) 10 MG TABS tablet Take 1 tablet (10 mg total) by mouth daily before breakfast.   furosemide (LASIX) 40 MG tablet Take 1 tablet (40 mg total) by mouth daily as needed. For leg swelling or weight gain. Take for 3 lb wt gain in 24 hr or 5 lb in 1 week   potassium chloride SA (KLOR-CON) 20 MEQ tablet Take 1 tablet (20 mEq total) by mouth daily as needed. Take only when you need to take a lasix tablet   sacubitril-valsartan (ENTRESTO) 49-51 MG Take 1 tablet by mouth 2 (two) times daily.   spironolactone (ALDACTONE) 25 MG tablet Take 0.5 tablets (12.5 mg total) by mouth daily.     Allergies:   Patient has no known allergies.   Social History   Socioeconomic History  Marital status: Single    Spouse name: Not on file   Number of children: Not on file   Years of education: Not on file   Highest education level: Not on file  Occupational History   Not on file  Tobacco Use   Smoking status: Never   Smokeless tobacco: Never  Substance and Sexual Activity   Alcohol use: Yes    Comment: 1-2 per week   Drug use: No   Sexual activity: Yes  Other Topics Concern   Not on file  Social History Narrative   Not on file   Social Determinants of Health   Financial Resource  Strain: Not on file  Food Insecurity: Not on file  Transportation Needs: Not on file  Physical Activity: Not on file  Stress: Not on file  Social Connections: Not on file     Family History: The patient's family history includes CAD in her mother; Heart failure in her maternal aunt, maternal aunt, maternal aunt, and maternal uncle; Hypertension in her mother; Prostate cancer in her father. There is no history of Anemia, Arrhythmia, Asthma, Clotting disorder, Fainting, or Hyperlipidemia.  ROS:   Please see the history of present illness.    (+) Fatigue/Malaise All other systems reviewed and are negative.  EKGs/Labs/Other Studies Reviewed:    The following studies were reviewed today:  RHC/LHC 02/06/2021: Findings: Ao = 117/78 (96) LV = 118/24 RA = 8 RV = 54/12 PA = 49/22 (34) PCW = 14 Fick cardiac output/index = 4.4/2.5 PVR = 4.6 Ao sat = 98% PA sat = 67%, 70%   Assessment: 1. Normal coronary arteries 2. 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 Mildly increased left-sided filling pressures with mild mixed pulmonary HTN   Plan/Discussion: cMRI today. Continue to titrate GDMT. Possibly home after MRI today or tomorrow.  Echo 02/05/2021: 1. Left ventricular ejection fraction, by estimation, is 20 to 25%. The  left ventricle has severely decreased function. The left ventricle  demonstrates regional wall motion abnormalities (see scoring  diagram/findings for description). Left ventricular  diastolic parameters are consistent with Grade II diastolic dysfunction  (pseudonormalization). Elevated left ventricular end-diastolic pressure.  There is akinesis of the left ventricular, entire inferoseptal wall,  apical segment and inferior wall. There  is akinesis of the left ventricular, apical anterior wall.   2. Right ventricular systolic function is normal. The right ventricular  size is normal. Tricuspid regurgitation signal is inadequate for assessing  PA pressure.   3. Left atrial  size was mildly dilated.   4. Right atrial size was mildly dilated.   5. The mitral valve is normal in structure. Mild mitral valve  regurgitation. No evidence of mitral stenosis.   6. The aortic valve is normal in structure. Aortic valve regurgitation is  not visualized. No aortic stenosis is present.   7. The inferior vena cava is normal in size with greater than 50%  respiratory variability, suggesting right atrial pressure of 3 mmHg.   EKG:   03/05/2021: EKG is not ordered today.   Recent Labs: 02/04/2021: TSH 0.782 02/05/2021: ALT 44 02/06/2021: Hemoglobin 13.6; Hemoglobin 13.3; Magnesium 1.5; Platelets 262 03/04/2021: B Natriuretic Peptide 722.2; BUN 19; Creatinine, Ser 0.96; Potassium 4.5; Sodium 137  Recent Lipid Panel No results found for: CHOL, TRIG, HDL, CHOLHDL, VLDL, LDLCALC, LDLDIRECT   Risk Assessment/Calculations:           Physical Exam:    VS:  BP 102/68   Pulse 70   Ht 5\' 1"  (1.549  m)   Wt 161 lb 9.6 oz (73.3 kg)   SpO2 100%   BMI 30.53 kg/m  , BMI Body mass index is 30.53 kg/m. GENERAL:  Well appearing HEENT: Pupils equal round and reactive, fundi not visualized, oral mucosa unremarkable NECK:  No jugular venous distention, waveform within normal limits, carotid upstroke brisk and symmetric, no bruits LUNGS:  Clear to auscultation bilaterally HEART:  RRR.  PMI not displaced or sustained,S1 and S2 within normal limits, no S3, no S4, no clicks, no rubs, no murmurs ABD:  Flat, positive bowel sounds normal in frequency in pitch, no bruits, no rebound, no guarding, no midline pulsatile mass, no hepatomegaly, no splenomegaly EXT:  2 plus pulses throughout, no edema, no cyanosis no clubbing SKIN:  No rashes no nodules NEURO:  Cranial nerves II through XII grossly intact, motor grossly intact throughout PSYCH:  Cognitively intact, oriented to person place and time   ASSESSMENT:    1. Chronic combined systolic and diastolic heart failure (HCC)   2. Essential  hypertension    PLAN:   Chronic combined systolic and diastolic heart failure (HCC) Recently diagnosed nonischemic cardiomyopathy.  She is doing extremely well and is on all the appropriate guideline directed medical therapy.  We had a discussion today about the fact that although I take her several of her family members, she is really best cared for at the advanced heart failure clinic for now.  Should she have recovery in her LVEF, I will be more than happy to take care of her in the future.  She has already been set up for genetic testing and will have a repeat echo in September to assess for ICD candidacy.  No changes recommended at this time.  Essential hypertension BP well-controlled on current regimen.   FU with Truitt Cruey C. Duke Salvia, MD, [facility] as needed.   Time spent: 35 minutes-Greater than 50% of this time was spent in counseling, explanation of diagnosis, planning of further management, and coordination of care.  Medication Adjustments/Labs and Tests Ordered: Current medicines are reviewed at length with the patient today.  Concerns regarding medicines are outlined above.  No orders of the defined types were placed in this encounter.  No orders of the defined types were placed in this encounter.   Patient Instructions  Medication Instructions:  Your physician recommends that you continue on your current medications as directed. Please refer to the Current Medication list given to you today.   Labwork: NONE  Testing/Procedures: NONE  Follow-Up: AS NEEDED      I,Mathew Stumpf,acting as a scribe for Chilton Si, MD.,have documented all relevant documentation on the behalf of Chilton Si, MD,as directed by  Chilton Si, MD while in the presence of Chilton Si, MD.  I, Devontay Celaya C. Duke Salvia, MD have reviewed all documentation for this visit.  The documentation of the exam, diagnosis, procedures, and orders on 03/05/2021 are all accurate and  complete.   Signed, Chilton Si, MD  03/05/2021 11:31 AM    [facility]

## 2021-03-05 NOTE — Patient Instructions (Signed)
Medication Instructions:  ?Your physician recommends that you continue on your current medications as directed. Please refer to the Current Medication list given to you today.  ? ?Labwork: ?NONE ? ?Testing/Procedures: ?NONE ? ?Follow-Up: ?AS NEEDED  ? ?  ?

## 2021-03-05 NOTE — Assessment & Plan Note (Signed)
Recently diagnosed nonischemic cardiomyopathy.  She is doing extremely well and is on all the appropriate guideline directed medical therapy.  We had a discussion today about the fact that although I take her several of her family members, she is really best cared for at the advanced heart failure clinic for now.  Should she have recovery in her LVEF, I will be more than happy to take care of her in the future.  She has already been set up for genetic testing and will have a repeat echo in September to assess for ICD candidacy.  No changes recommended at this time.

## 2021-03-06 ENCOUNTER — Telehealth (HOSPITAL_COMMUNITY): Payer: Self-pay | Admitting: *Deleted

## 2021-03-06 NOTE — Telephone Encounter (Signed)
Pt emailed disability paperwork. I printed paperwork and placed in disability folder.

## 2021-03-24 ENCOUNTER — Other Ambulatory Visit (HOSPITAL_COMMUNITY): Payer: Self-pay | Admitting: *Deleted

## 2021-03-24 DIAGNOSIS — I429 Cardiomyopathy, unspecified: Secondary | ICD-10-CM

## 2021-03-24 NOTE — Progress Notes (Signed)
Advanced Heart Failure Clinic Note   PCP: Linward Foster, MD PCP-Cardiologist: Chilton Si, MD  HF Cardiologist: Dr. Gala Romney  HPI: 50 y/o AAF w/ h/o HTN and new systolic heart failure.    She presented to the [facility] w/ acute HF. She diuresed well w/ IV Lasix. R/LHC showed normal coronaries and mildly increased left-sided filling pressures with mild mixed pulmonary HTN. cMRI completed & GDMT initiated. Her discharge weight was 163 lbs.  Today she returns for HF follow up with her father. Overall feeling fine, some SOB with stairs. Denies CP, dizziness, edema, or PND/Orthopnea. Appetite ok. No fever or chills. Weight at home 159-162 pounds. Taking all medications. Takes lasix 1x/week for swelling. Cough at night has resolved.  She has not started exercising yet, but plans to.  Cardiac Studies:   - Echo (6/22): EF 20-25%, LV severely depressed, RV ok  - L/RHC (6/22): Normal cors and mildly increased left-sided filling pressures with mild mixed pulmonary HTN. Ao = 117/78 (96) LV = 118/24 RA = 8 RV = 54/12 PA = 49/22 (34) PCW = 14 Fick cardiac output/index = 4.4/2.5 PVR = 4.6 Ao sat = 98% PA sat = 67%, 70%  - cMRI (6/22): EF 23%, LGE in the left ventricular myocardium: Mid myocardial in the basal anteroseptal and inferoseptal. Mild RV systolic dysfunction (RVEF =39%).   IMPRESSION: Severely reduced LVEF, given breath hold artifact during LGE assessment sensitivity for LGE is reduced (in some PSIR views, this appears to be a septal perforator vessel).   ROS: All systems reviewed and negative except as per HPI.   Past Medical History:  Diagnosis Date   Back pain    Chest pain, unspecified    Chronic combined systolic and diastolic heart failure (HCC) 02/05/2021   Essential hypertension 03/05/2021   Hypertension    Palpitations    Current Outpatient Medications  Medication Sig Dispense Refill   carvedilol (COREG) 3.125 MG tablet Take 1 tablet (3.125 mg total)  by mouth 2 (two) times daily. 60 tablet 11   dapagliflozin propanediol (FARXIGA) 10 MG TABS tablet Take 1 tablet (10 mg total) by mouth daily before breakfast. 30 tablet 5   furosemide (LASIX) 40 MG tablet Take 1 tablet (40 mg total) by mouth daily as needed. For leg swelling or weight gain. Take for 3 lb wt gain in 24 hr or 5 lb in 1 week 30 tablet 11   potassium chloride SA (KLOR-CON) 20 MEQ tablet Take 1 tablet (20 mEq total) by mouth daily as needed. Take only when you need to take a lasix tablet 30 tablet 5   sacubitril-valsartan (ENTRESTO) 49-51 MG Take 1 tablet by mouth 2 (two) times daily. 60 tablet 5   spironolactone (ALDACTONE) 25 MG tablet Take 0.5 tablets (12.5 mg total) by mouth daily. 30 tablet 5   No current facility-administered medications for this encounter.   No Known Allergies  Social History   Socioeconomic History   Marital status: Single    Spouse name: Not on file   Number of children: Not on file   Years of education: Not on file   Highest education level: Not on file  Occupational History   Not on file  Tobacco Use   Smoking status: Never   Smokeless tobacco: Never  Substance and Sexual Activity   Alcohol use: Yes    Comment: 1-2 per week   Drug use: No   Sexual activity: Yes  Other Topics Concern   Not on file  Social History Narrative   Not on file   Social Determinants of Health   Financial Resource Strain: Not on file  Food Insecurity: Not on file  Transportation Needs: Not on file  Physical Activity: Not on file  Stress: Not on file  Social Connections: Not on file  Intimate Partner Violence: Not on file   Family History  Problem Relation Age of Onset   Hypertension Mother    CAD Mother    Prostate cancer Father    Heart failure Maternal Aunt    Heart failure Maternal Aunt    Heart failure Maternal Aunt    Heart failure Maternal Uncle    Anemia Neg Hx    Arrhythmia Neg Hx    Asthma Neg Hx    Clotting disorder Neg Hx    Fainting  Neg Hx    Hyperlipidemia Neg Hx    BP 110/70   Pulse 60   Wt 74 kg (163 lb 3.2 oz)   SpO2 99%   BMI 30.84 kg/m   Wt Readings from Last 3 Encounters:  03/25/21 74 kg (163 lb 3.2 oz)  03/05/21 73.3 kg (161 lb 9.6 oz)  03/04/21 72.8 kg (160 lb 6.4 oz)   PHYSICAL EXAM: General:  NAD. No resp difficulty HEENT: Normal Neck: Supple. No JVD. Carotids 2+ bilat; no bruits. No lymphadenopathy or thryomegaly appreciated. Cor: PMI nondisplaced. Regular rate & rhythm. No rubs, gallops or murmurs. Lungs: Clear Abdomen: Soft, nontender, nondistended. No hepatosplenomegaly. No bruits or masses. Good bowel sounds. Extremities: No cyanosis, clubbing, rash, trace pedal edema Neuro: Alert & oriented x 3, cranial nerves grossly intact. Moves all 4 extremities w/o difficulty. Affect pleasant.  ASSESSMENT & PLAN: Chronic systolic Heart Failure (New): - Echo EF 20-25%, GIIDD, RV normal. + RWMAs (AK LV, entire inferoseptal wall, apical segment and inferior wall) - R/LHC (6/22): normal cors and mildly increased left-sided filling pressures with mild mixed pulmonary HTN. - cMRI (6/22): Severely reduced systolic dysfunction LVEF 23%, mild RV systolic dysfunction RVEF 39%, breath hold artifact dueing LGE assessment - cMRI + genetic testing (? Familial CM given CHF on maternal side). She has been referred to Dr. Jomarie Longs and has appt scheduled for next week.  - NYHA II, volume ok today. - Continue carvedilol 3.125 mg bid. HR limits further titration today. - Continue Farxiga 10 mg daily. - Continue Entresto 49/51 mg bid. - Increase spiro to 25 mg daily. - Continue lasix + KCl prn - BMET today, repeat in 10 days.   2. HTN: - Controlled. - Continue with gradual GDMT titration. - Arrange sleep study.  3. Snoring - Sleep study.  4. Obesity: - Body mass index is 30.84 kg/m. - Encouraged dietary changes and gentle exercise to aid in weight reduction.  5. Pre-Diabetes: - A1c 6.4. On Farxiga. - Has been  given info on diet/exercise  - Will send note to her new PCP, Dr. Kenney Houseman w/ [facility] in [place], [place].  - Follow up in 3-4 weeks with APP for further GDMT titration & see Dr. Gala Romney in 6-8 weeks with echo.  Anderson [place], FNP 03/25/21

## 2021-03-25 ENCOUNTER — Other Ambulatory Visit: Payer: Self-pay

## 2021-03-25 ENCOUNTER — Telehealth (HOSPITAL_COMMUNITY): Payer: Self-pay | Admitting: Family Medicine

## 2021-03-25 ENCOUNTER — Ambulatory Visit (HOSPITAL_COMMUNITY)
Admission: RE | Admit: 2021-03-25 | Discharge: 2021-03-25 | Disposition: A | Discharge status: Home / Self Care | Payer: BC Managed Care – PPO | Source: Ambulatory Visit | Attending: Family Medicine | Admitting: Family Medicine

## 2021-03-25 ENCOUNTER — Encounter (HOSPITAL_COMMUNITY): Payer: Self-pay

## 2021-03-25 VITALS — BP 110/70 | HR 60 | Wt 163.2 lb

## 2021-03-25 DIAGNOSIS — R0683 Snoring: Secondary | ICD-10-CM | POA: Diagnosis not present

## 2021-03-25 DIAGNOSIS — I10 Essential (primary) hypertension: Secondary | ICD-10-CM

## 2021-03-25 DIAGNOSIS — Z7182 Exercise counseling: Secondary | ICD-10-CM | POA: Diagnosis not present

## 2021-03-25 DIAGNOSIS — Z6830 Body mass index (BMI) 30.0-30.9, adult: Secondary | ICD-10-CM

## 2021-03-25 DIAGNOSIS — I5022 Chronic systolic (congestive) heart failure: Secondary | ICD-10-CM

## 2021-03-25 DIAGNOSIS — I110 Hypertensive heart disease with heart failure: Secondary | ICD-10-CM | POA: Diagnosis not present

## 2021-03-25 DIAGNOSIS — I272 Pulmonary hypertension, unspecified: Secondary | ICD-10-CM | POA: Insufficient documentation

## 2021-03-25 DIAGNOSIS — Z7901 Long term (current) use of anticoagulants: Secondary | ICD-10-CM | POA: Diagnosis not present

## 2021-03-25 DIAGNOSIS — R7303 Prediabetes: Secondary | ICD-10-CM | POA: Diagnosis not present

## 2021-03-25 DIAGNOSIS — I5042 Chronic combined systolic (congestive) and diastolic (congestive) heart failure: Secondary | ICD-10-CM

## 2021-03-25 DIAGNOSIS — Z79899 Other long term (current) drug therapy: Secondary | ICD-10-CM | POA: Diagnosis not present

## 2021-03-25 DIAGNOSIS — Z8249 Family history of ischemic heart disease and other diseases of the circulatory system: Secondary | ICD-10-CM | POA: Diagnosis not present

## 2021-03-25 DIAGNOSIS — Z7984 Long term (current) use of oral hypoglycemic drugs: Secondary | ICD-10-CM | POA: Diagnosis not present

## 2021-03-25 DIAGNOSIS — R0602 Shortness of breath: Secondary | ICD-10-CM | POA: Diagnosis present

## 2021-03-25 DIAGNOSIS — E669 Obesity, unspecified: Secondary | ICD-10-CM | POA: Diagnosis not present

## 2021-03-25 LAB — BASIC METABOLIC PANEL
Anion gap: 7 (ref 5–15)
BUN: 18 mg/dL (ref 6–20)
CO2: 24 mmol/L (ref 22–32)
Calcium: 8.8 mg/dL — ABNORMAL LOW (ref 8.9–10.3)
Chloride: 106 mmol/L (ref 98–111)
Creatinine, Ser: 0.93 mg/dL (ref 0.44–1.00)
GFR, Estimated: >60 mL/min (ref >60)
Glucose, Bld: 98 mg/dL (ref 70–99)
Potassium: 3.9 mmol/L (ref 3.5–5.1)
Sodium: 137 mmol/L (ref 135–145)

## 2021-03-25 MED ORDER — SPIRONOLACTONE 25 MG PO TABS: 25.0000 mg | ORAL_TABLET | Freq: Every day | ORAL | 5 refills | Status: DC

## 2021-03-25 NOTE — Telephone Encounter (Signed)
error 

## 2021-03-25 NOTE — Telephone Encounter (Signed)
Forms completed and signed by Dr Gala Romney with an approximate RTW date of 04/23/21, form faxed to [facility] at [phone]

## 2021-03-25 NOTE — Patient Instructions (Signed)
INCREASE Spirolactone to 25 mg, one tab daily   Labs today We will only contact you if something comes back abnormal or we need to make some changes. Otherwise no news is good news!  Labs needed in 7-10 days  Your physician recommends that you schedule a follow-up appointment in: 6-8 weeks with Dr Gala Romney and echo  Your physician has requested that you have an echocardiogram. Echocardiography is a painless test that uses sound waves to create images of your heart. It provides your doctor with information about the size and shape of your heart and how well your heart's chambers and valves are working. This procedure takes approximately one hour. There are no restrictions for this procedure.  Do the following things EVERYDAY: Weigh yourself in the morning before breakfast. Write it down and keep it in a log. Take your medicines as prescribed Eat low salt foods--Limit salt (sodium) to 2000 mg per day.  Stay as active as you can everyday Limit all fluids for the day to less than 2 liters  milAt the Advanced Heart Failure Clinic, you and your health needs are our priority. As part of our continuing mission to provide you with exceptional heart care, we have created designated Provider Care Teams. These Care Teams include your primary Cardiologist (physician) and Advanced Practice Providers (APPs- Physician Assistants and Nurse Practitioners) who all work together to provide you with the care you need, when you need it.   You may see any of the following providers on your designated Care Team at your next follow up: Dr Arvilla Meres Dr Marca Ancona Dr Brandon Melnick, NP Robbie Lis, [place] Mikki Santee Karle Plumber, PharmD   Please be sure to bring in all your medications bottles to every appointment.

## 2021-03-31 ENCOUNTER — Other Ambulatory Visit: Payer: Self-pay

## 2021-03-31 ENCOUNTER — Ambulatory Visit: Payer: BC Managed Care – PPO | Admitting: Genetic Counselor

## 2021-04-03 ENCOUNTER — Telehealth (HOSPITAL_COMMUNITY): Payer: Self-pay | Admitting: Surgery

## 2021-04-03 NOTE — Telephone Encounter (Signed)
I called patient back regarding ordered home sleep study.  I informed her that we have not yet received insurance pre certification for the home sleep study.  I also let her know that once it is approved we will call her to inform her so that she can proceed with the test.

## 2021-04-07 ENCOUNTER — Ambulatory Visit (HOSPITAL_COMMUNITY): Payer: BC Managed Care – PPO

## 2021-04-08 NOTE — Progress Notes (Signed)
Pre Test GC  Referring Provider: Arvilla Meres, MD   Referral Reason  Wendy Stephens was referred for genetic consult and testing for Non-ischemic cardiomyopathy (NICM) in light of the significant family history of CHF in her family.  Personal Medical Information Wendy Stephens (III.1 on pedigree), a pleasant 50 year old African American woman is here today with her father, Wendy Stephens. Previously she worked at [employer] in [place] and is currently on short term disability. She reports noticing fluid retention in her feet and ankles in 12/28/2019 that progressed to her mid-thigh this year. She says that this used to be episodic in 28-Dec-2019 but by Feb/March of 12/27/20 she was having significant fluid retention. She was hospitalized on June14th and testing indicated acute systolic CHF. She says that she has developed a dry cough, especially when lying down at night and has to sleep on two pillows or on the recliner. She reports having increased dyspnea and fatigue since May 2022.   Traditional Risk Factors Wendy Stephens denies having other cardiac or systemic conditions that can cause non-ischemic cardiomyopathy, namely, ischemic heart disease, myocarditis, infiltrative myocardial disease (amyloidosis, sarcoidosis, hemochromatosis), infection with HIV virus, connective tissue disease (such as systemic lupus erythematosus etc.). She also denies substance abuse (chronic alcohol abuse, cocaine abuse), doxorubicin therapy and of having other cardiovascular diseases (valvular heart disease, HCM). She was diagnosed with hypertension around age 78 and states that it is well controlled.    Family history Wendy Stephens (III.1) has a younger sister, age 31 with HTN. She tells me that her sister is seeing Wendy Stephens at [facility] but is not aware of the details of her condition. Wendy Stephens does not have children but has a 73 y.o niece (IV.1).   Her mother (II.3) used to be a school bus driver for 34 years. She had  sudden onset of fatigue when her husband was at the ER for a UTI. Wendy Stephens says that she felt nauseous, went home and began having dyspnea. The EMS was called and she was admitted at the hospital where she was found to have a "weak heart from a leaky valve". She tells me that her mother had a syncope event at a birthday party and had an ICD placed at age 47. She died suddenly after collapsing at home at age 6.   There is a significant family history of CHF in her maternal relatives. She reports 4 maternal aunts with CHF- one had an ICD at 72 (II.6) and died at age 36. Her daughter (III.5) had post-partum cardiomyopathy at age 25 and is reported to currently have "weak heart muscle". Another aunt (II.7) had CHF and ICD implanted in her 94s. She had a LVAD in 12-28-2014 but died 2 years later. The third aunt had CHF, Afib, cancer and died of a TIA in her 68s. Her daughter, age 45, is in good health (III.8). The fourth aunt has a heart transplant at 63 but died soon after from a bacterial infection. She has two daughters- one, age 78 has CHF (III.9) and the younger daughter collapsed and died suddenly at a retirement party. She believes an autopsy was done but is unsure of the results.  Her maternal grandmother (I.4) had CHF and was scheduled to have a device implanted but died before the procedure could be performed at age 79.   Pre-test Genetic Consult notes  I reviewed the different genetic cardiomyopathies that are considered non-ischemic cardiomyopathy, namely ARVC (now called ACM), DCM, HCM and LVNC. I discussed the  genetics of HCM, DCM, LVNC and ACM, namely inheritance, incomplete penetrance, variable expression and digenic/compound mutations that can be seen in some patients. We walked through the process of genetic testing. I explained to her that there are three possible outcomes of genetic testing; namely positive, negative and finding a variant of unknown significance. A positive outcome can be expected in  cases that do not have risk factors for a cardiomyopathy, present early in life with increased severity and have a family history of sudden cardiac death and/or a relative that has been diagnosed with cardiomyopathy. Limitations in current genetic testing methodology can produce a negative result. Variants of unknown significance (VUS) are also observed. I explained to her that typically a VUS is so classified if the variant is not well understood as very few individuals have been reported to harbor this variant or its role in gene function has not been elucidated. The potential outcomes of genetic testing and subsequent management of at-risk family members were discussed so as to manage expectations.   Impression  In summary, [name]'s early age and severity of presentation along with the significant family history of CHF with heart transplant, LVAD and sudden death in several maternal relatives is indicative of a genetic condition, As this is an autosomal dominant condition, first-degree relatives are at a 50% risk of inheriting NICM. Genetic testing for genes implicated in nonischemic cardiomyopathy is highly recommended. The test will determine the underlying genetic basis of her condition and stratify risk of NICM in her family.   In addition, we discussed the protections afforded by the Genetic Information Non-Discrimination Act (GINA). I explained to her that GINA protects her from losing her employment or health insurance based on her genotype. However, these protections do not cover life insurance and disability. She verbalized understanding of this and notes that her sister has life insurance.  Please note that the patient has not been counseled in this visit on personal, cultural or ethical issues that she may face due to her heart condition.   Plan After a thorough discussion of the risk and benefits of genetic testing for NICM, [name] states her intent to pursue genetic testing for NICM and  signed the informed consent form. Blood was drawn today for testing.   Wendy Stephens, Ph.D, [facility] Clinical Molecular Geneticist

## 2021-04-10 ENCOUNTER — Other Ambulatory Visit (HOSPITAL_COMMUNITY): Payer: Self-pay | Admitting: *Deleted

## 2021-04-10 ENCOUNTER — Telehealth (HOSPITAL_COMMUNITY): Payer: Self-pay | Admitting: Surgery

## 2021-04-10 DIAGNOSIS — I429 Cardiomyopathy, unspecified: Secondary | ICD-10-CM

## 2021-04-10 NOTE — Telephone Encounter (Signed)
I called Wendy Stephens to inform her that her insurance denied the home sleep study. She tells me that she will bring the equipment back on Wednesday when she has an appt in the [facility].

## 2021-04-13 ENCOUNTER — Telehealth (HOSPITAL_COMMUNITY): Payer: Self-pay | Admitting: Surgery

## 2021-04-13 ENCOUNTER — Encounter (HOSPITAL_COMMUNITY): Payer: Self-pay

## 2021-04-13 DIAGNOSIS — I5022 Chronic systolic (congestive) heart failure: Secondary | ICD-10-CM

## 2021-04-13 DIAGNOSIS — R0683 Snoring: Secondary | ICD-10-CM

## 2021-04-13 NOTE — Telephone Encounter (Signed)
Order placed for referral for split night sleep study per Prince Rome NP.

## 2021-04-15 ENCOUNTER — Encounter (HOSPITAL_COMMUNITY): Payer: BC Managed Care – PPO

## 2021-04-21 NOTE — Progress Notes (Addendum)
Advanced Heart Failure Clinic Note   PCP: Linward Foster, MD PCP-Cardiologist: Chilton Si, MD  HF Cardiologist: Dr. Gala Romney  HPI: 50 y/o AAF w/ h/o HTN and new systolic heart failure.    She presented to the [facility] on 02/04/21 w/ acute HF. She diuresed well w/ IV Lasix. R/LHC showed normal coronaries and mildly increased left-sided filling pressures with mild mixed pulmonary HTN. cMRI completed & GDMT initiated. Her discharge weight was 163 lbs.  Appointment with Dr. Jomarie Longs for genetic testing underway (8/22).  Today she returns for HF follow up here with her father. Recovering from mild COVID, some residual fatigue. Grad school has started back and she is taking classes remotely. No significant exertional dyspnea on stairs or walking on flat ground. Denies CP, dizziness, edema, or PND/Orthopnea. Appetite ok. No fever or chills. Weight at home 161 pounds. Taking all medications. Has not needed lasix recently. Noticing some brain fog since discharge from hospital.  Cardiac Studies:   - Echo (6/22): EF 20-25%, LV severely depressed, RV ok  - L/RHC (6/22): Normal cors and mildly increased left-sided filling pressures with mild mixed pulmonary HTN. Ao = 117/78 (96) LV = 118/24 RA = 8 RV = 54/12 PA = 49/22 (34) PCW = 14 Fick cardiac output/index = 4.4/2.5 PVR = 4.6 Ao sat = 98% PA sat = 67%, 70%  - cMRI (6/22): EF 23%, LGE in the left ventricular myocardium: Mid myocardial in the basal anteroseptal and inferoseptal. Mild RV systolic dysfunction (RVEF =39%).   IMPRESSION: Severely reduced LVEF, given breath hold artifact during LGE assessment sensitivity for LGE is reduced (in some PSIR views, this appears to be a septal perforator vessel).   ROS: All systems reviewed and negative except as per HPI.   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 60 tablet 11   dapagliflozin propanediol (FARXIGA) 10 MG TABS tablet Take 1 tablet (10 mg total) by mouth daily before breakfast. 30 tablet 5   furosemide (LASIX) 40 MG tablet Take 1 tablet (40 mg total) by mouth daily as needed. For leg swelling or weight gain. Take for 3 lb wt gain in 24 hr or 5 lb in 1 week 30 tablet 11   potassium chloride SA (KLOR-CON) 20 MEQ tablet Take 1 tablet (20 mEq total) by mouth daily as needed. Take only when you need to take a lasix tablet 30 tablet 5   sacubitril-valsartan (ENTRESTO) 49-51 MG Take 1 tablet by mouth 2 (two) times daily. 60 tablet 5   spironolactone (ALDACTONE) 25 MG tablet Take 1 tablet (25 mg total) by mouth daily. 30 tablet 5   No current facility-administered medications for this encounter.   No Known Allergies  Social History   Socioeconomic History   Marital status: Single    Spouse name: Not on file   Number of children: Not on file   Years of education: Not on file   Highest education level: Not on file  Occupational History   Not on file  Tobacco Use   Smoking status: Never   Smokeless tobacco: Never  Substance and Sexual Activity   Alcohol use: Yes  Comment: 1-2 per week   Drug use: No   Sexual activity: Yes  Other Topics Concern   Not on file  Social History Narrative   Not on file   Social Determinants of Health   Financial Resource Strain: Not on file  Food Insecurity: Not on file  Transportation Needs: Not on file  Physical Activity: Not on file  Stress: Not on file  Social Connections: Not on file  Intimate Partner Violence: Not on file   Family History  Problem Relation Age of Onset   Hypertension Mother    CAD Mother    Prostate cancer Father    Heart failure Maternal Aunt    Heart  failure Maternal Aunt    Heart failure Maternal Aunt    Heart failure Maternal Uncle    Anemia Neg Hx    Arrhythmia Neg Hx    Asthma Neg Hx    Clotting disorder Neg Hx    Fainting Neg Hx    Hyperlipidemia Neg Hx    BP 110/79   Pulse 64   Wt 74.4 kg (164 lb)   SpO2 99%   BMI 30.99 kg/m   Wt Readings from Last 3 Encounters:  04/22/21 74.4 kg (164 lb)  03/25/21 74 kg (163 lb 3.2 oz)  03/05/21 73.3 kg (161 lb 9.6 oz)   PHYSICAL EXAM: General:  NAD. No resp difficulty HEENT: Normal Neck: Supple. No JVD. Carotids 2+ bilat; no bruits. No lymphadenopathy or thryomegaly appreciated. Cor: PMI nondisplaced. Regular rate & rhythm. No rubs, gallops or murmurs. Lungs: Clear Abdomen: Soft, nontender, nondistended. No hepatosplenomegaly. No bruits or masses. Good bowel sounds. Extremities: No cyanosis, clubbing, rash, edema Neuro: Alert & oriented x 3, cranial nerves grossly intact. Moves all 4 extremities w/o difficulty. Affect pleasant.  ASSESSMENT & PLAN: Chronic systolic Heart Failure (New): - Echo (6/22): EF 20-25%, GIIDD, RV normal. + RWMAs (AK LV, entire inferoseptal wall, apical segment and inferior wall) - R/LHC (6/22): normal cors and mildly increased left-sided filling pressures with mild mixed pulmonary HTN. - cMRI (6/22): Severely reduced systolic dysfunction LVEF 23%, mild RV systolic dysfunction RVEF 39%, breath hold artifact dueing LGE assessment - cMRI + genetic testing (? Familial CM given CHF on maternal side). She has followed up with Dr. Jomarie Longs w/ labs pending. - NYHA I-II, volume good today. - Continue carvedilol 3.125 mg bid.  - Continue Farxiga 10 mg daily. - Continue Entresto 49/51 mg bid. - Continue spiro 25 mg daily. - Continue lasix + KCl prn. - BMET today.   2. HTN: - Well-controlled. - Continue current medications.  3. Snoring - Insurance won't cover outpatient sleep study; will arrange in-patient.  4. Obesity: - Body mass index is 30.99 kg/m. -  Encouraged dietary changes and gentle exercise to aid in weight reduction.  5. Pre-Diabetes: - A1c 6.4. On Farxiga. - Has been given info on diet/exercise.  - We discussed return to work. She is agreeable to stay out until her repeat echo and follow up. Her sister and father have expressed concerns to me that she is a "work-a-cholic" and likely will not be able to go back in a reduced capacity. I gave her a letter for her RTW work today.  Her GDMT is optimized. Follow up with Dr. Gala Romney w/ echo as scheduled.  Anderson Malta [place], FNP 04/22/21

## 2021-04-22 ENCOUNTER — Other Ambulatory Visit: Payer: Self-pay

## 2021-04-22 ENCOUNTER — Ambulatory Visit (HOSPITAL_COMMUNITY)
Admission: RE | Admit: 2021-04-22 | Discharge: 2021-04-22 | Disposition: A | Discharge status: Home / Self Care | Payer: BC Managed Care – PPO | Source: Ambulatory Visit | Attending: Family Medicine | Admitting: Family Medicine

## 2021-04-22 ENCOUNTER — Encounter (HOSPITAL_COMMUNITY): Payer: Self-pay

## 2021-04-22 VITALS — BP 110/79 | HR 64 | Wt 164.0 lb

## 2021-04-22 DIAGNOSIS — R7303 Prediabetes: Secondary | ICD-10-CM | POA: Insufficient documentation

## 2021-04-22 DIAGNOSIS — I5042 Chronic combined systolic (congestive) and diastolic (congestive) heart failure: Secondary | ICD-10-CM | POA: Insufficient documentation

## 2021-04-22 DIAGNOSIS — I110 Hypertensive heart disease with heart failure: Secondary | ICD-10-CM | POA: Diagnosis not present

## 2021-04-22 DIAGNOSIS — Z6830 Body mass index (BMI) 30.0-30.9, adult: Secondary | ICD-10-CM | POA: Insufficient documentation

## 2021-04-22 DIAGNOSIS — Z7984 Long term (current) use of oral hypoglycemic drugs: Secondary | ICD-10-CM | POA: Diagnosis not present

## 2021-04-22 DIAGNOSIS — I272 Pulmonary hypertension, unspecified: Secondary | ICD-10-CM | POA: Insufficient documentation

## 2021-04-22 DIAGNOSIS — I10 Essential (primary) hypertension: Secondary | ICD-10-CM

## 2021-04-22 DIAGNOSIS — Z8249 Family history of ischemic heart disease and other diseases of the circulatory system: Secondary | ICD-10-CM | POA: Diagnosis not present

## 2021-04-22 DIAGNOSIS — Z79899 Other long term (current) drug therapy: Secondary | ICD-10-CM | POA: Diagnosis not present

## 2021-04-22 DIAGNOSIS — R5383 Other fatigue: Secondary | ICD-10-CM | POA: Insufficient documentation

## 2021-04-22 DIAGNOSIS — Z7901 Long term (current) use of anticoagulants: Secondary | ICD-10-CM | POA: Insufficient documentation

## 2021-04-22 DIAGNOSIS — Z7182 Exercise counseling: Secondary | ICD-10-CM | POA: Diagnosis not present

## 2021-04-22 DIAGNOSIS — R0683 Snoring: Secondary | ICD-10-CM | POA: Diagnosis not present

## 2021-04-22 DIAGNOSIS — E669 Obesity, unspecified: Secondary | ICD-10-CM | POA: Diagnosis not present

## 2021-04-22 DIAGNOSIS — I5022 Chronic systolic (congestive) heart failure: Secondary | ICD-10-CM | POA: Diagnosis not present

## 2021-04-22 LAB — BASIC METABOLIC PANEL
Anion gap: 5 (ref 5–15)
BUN: 19 mg/dL (ref 6–20)
CO2: 25 mmol/L (ref 22–32)
Calcium: 9.2 mg/dL (ref 8.9–10.3)
Chloride: 107 mmol/L (ref 98–111)
Creatinine, Ser: 0.86 mg/dL (ref 0.44–1.00)
GFR, Estimated: >60 mL/min (ref >60)
Glucose, Bld: 95 mg/dL (ref 70–99)
Potassium: 4.2 mmol/L (ref 3.5–5.1)
Sodium: 137 mmol/L (ref 135–145)

## 2021-04-22 NOTE — Patient Instructions (Signed)
It was great to see you today! No medication changes are needed at this time.   Labs today We will only contact you if something comes back abnormal or we need to make some changes. Otherwise no news is good news!  Keep followup as scheduled   Do the following things EVERYDAY: Weigh yourself in the morning before breakfast. Write it down and keep it in a log. Take your medicines as prescribed Eat low salt foods--Limit salt (sodium) to 2000 mg per day.  Stay as active as you can everyday Limit all fluids for the day to less than 2 liters  At the [facility], you and your health needs are our priority. As part of our continuing mission to provide you with exceptional heart care, we have created designated Provider Care Teams. These Care Teams include your primary Cardiologist (physician) and Advanced Practice Providers (APPs- Physician Assistants and Nurse Practitioners) who all work together to provide you with the care you need, when you need it.   You may see any of the following providers on your designated Care Team at your next follow up: Dr Daniel Bensimhon Dr Dalton McLean Dr Brandon Winfrey Amy Clegg, NP Brittainy Simmons, PA Jessica Milford,NP Lauren Kemp, PharmD   Please be sure to bring in all your medications bottles to every appointment.   If you have any questions or concerns before your next appointment please send us a message through mychart or call our office at [phone].    TO LEAVE A MESSAGE FOR THE NURSE SELECT OPTION 2, PLEASE LEAVE A MESSAGE INCLUDING: YOUR NAME DATE OF BIRTH CALL BACK NUMBER REASON FOR CALL**this is important as we prioritize the call backs  YOU WILL RECEIVE A CALL BACK THE SAME DAY AS LONG AS YOU CALL BEFORE 4:00 PM  

## 2021-04-24 ENCOUNTER — Ambulatory Visit (HOSPITAL_BASED_OUTPATIENT_CLINIC_OR_DEPARTMENT_OTHER): Payer: Self-pay | Admitting: Cardiovascular Disease

## 2021-04-29 ENCOUNTER — Telehealth (HOSPITAL_COMMUNITY): Payer: Self-pay | Admitting: Cardiology

## 2021-04-29 NOTE — Telephone Encounter (Signed)
Spironolactone has been associated with GI upset/diarrhea. However, it is not a common side effect and would not expect such a significant change after increasing the dose from 12.5 mg daily to 25 mg daily.

## 2021-04-29 NOTE — Telephone Encounter (Signed)
Called patient to review labs received from PCP [facility] 645 Per Prince Rome, NP Confirm pt is taking all HF meds and not swelling/gaining weight  Spoke with patient Reports compliance with medications and denies weight gain or swelling. However patient does have concerns with increased diarrhea. Reports shortly after spiro increase, diarrhea started. 2-4 loose stools per day.  Advised would forward to provider and pharmacist for medication review.

## 2021-04-30 MED ORDER — SPIRONOLACTONE 25 MG PO TABS: 12.5000 mg | ORAL_TABLET | Freq: Every day | ORAL | 5 refills | Status: DC

## 2021-04-30 NOTE — Telephone Encounter (Signed)
Patient aware and voiced understanding Reports she will decrease meds

## 2021-05-04 ENCOUNTER — Other Ambulatory Visit (HOSPITAL_COMMUNITY): Payer: Self-pay | Admitting: Internal Medicine

## 2021-05-04 ENCOUNTER — Encounter (HOSPITAL_COMMUNITY): Payer: Self-pay

## 2021-05-06 ENCOUNTER — Telehealth (HOSPITAL_COMMUNITY): Payer: Self-pay | Admitting: *Deleted

## 2021-05-06 NOTE — Telephone Encounter (Signed)
Pt left vm following up on disability paperwork. Pt said she left paperwork that needed to be completed by today and requested a call with update. I did not see any notes about paperwork.  Routed to [employer]

## 2021-05-07 NOTE — Telephone Encounter (Signed)
Forms completed, signed by Dr Gala Romney and faxed into [employer] on 9/14, mychart message sent to pt and copy to mailed to her as well.

## 2021-05-15 ENCOUNTER — Encounter (HOSPITAL_COMMUNITY): Payer: Self-pay | Admitting: Internal Medicine

## 2021-05-15 ENCOUNTER — Ambulatory Visit (HOSPITAL_COMMUNITY)
Admission: RE | Admit: 2021-05-15 | Discharge: 2021-05-15 | Disposition: A | Discharge status: Home / Self Care | Payer: BC Managed Care – PPO | Source: Ambulatory Visit | Attending: Internal Medicine | Admitting: Internal Medicine

## 2021-05-15 ENCOUNTER — Other Ambulatory Visit: Payer: Self-pay

## 2021-05-15 ENCOUNTER — Ambulatory Visit (HOSPITAL_BASED_OUTPATIENT_CLINIC_OR_DEPARTMENT_OTHER)
Admission: RE | Admit: 2021-05-15 | Discharge: 2021-05-15 | Disposition: A | Discharge status: Home / Self Care | Payer: BC Managed Care – PPO | Source: Ambulatory Visit | Attending: Internal Medicine | Admitting: Internal Medicine

## 2021-05-15 VITALS — BP 118/76 | HR 55 | Wt 160.4 lb

## 2021-05-15 DIAGNOSIS — I5042 Chronic combined systolic (congestive) and diastolic (congestive) heart failure: Secondary | ICD-10-CM

## 2021-05-15 DIAGNOSIS — Z7901 Long term (current) use of anticoagulants: Secondary | ICD-10-CM | POA: Diagnosis not present

## 2021-05-15 DIAGNOSIS — Z6830 Body mass index (BMI) 30.0-30.9, adult: Secondary | ICD-10-CM | POA: Diagnosis not present

## 2021-05-15 DIAGNOSIS — I110 Hypertensive heart disease with heart failure: Secondary | ICD-10-CM | POA: Diagnosis not present

## 2021-05-15 DIAGNOSIS — Z7984 Long term (current) use of oral hypoglycemic drugs: Secondary | ICD-10-CM | POA: Diagnosis not present

## 2021-05-15 DIAGNOSIS — E119 Type 2 diabetes mellitus without complications: Secondary | ICD-10-CM | POA: Insufficient documentation

## 2021-05-15 DIAGNOSIS — R0602 Shortness of breath: Secondary | ICD-10-CM | POA: Diagnosis present

## 2021-05-15 DIAGNOSIS — Z8249 Family history of ischemic heart disease and other diseases of the circulatory system: Secondary | ICD-10-CM | POA: Insufficient documentation

## 2021-05-15 DIAGNOSIS — E669 Obesity, unspecified: Secondary | ICD-10-CM | POA: Diagnosis not present

## 2021-05-15 DIAGNOSIS — I10 Essential (primary) hypertension: Secondary | ICD-10-CM | POA: Diagnosis not present

## 2021-05-15 DIAGNOSIS — I272 Pulmonary hypertension, unspecified: Secondary | ICD-10-CM | POA: Diagnosis not present

## 2021-05-15 DIAGNOSIS — Z79899 Other long term (current) drug therapy: Secondary | ICD-10-CM | POA: Diagnosis not present

## 2021-05-15 LAB — ECHOCARDIOGRAM COMPLETE
Area-P 1/2: 4.89 cm2
Calc EF: 32.4 %
S' Lateral: 4.5 cm
Single Plane A2C EF: 33.9 %
Single Plane A4C EF: 31.8 %

## 2021-05-15 LAB — BASIC METABOLIC PANEL
Anion gap: 7 (ref 5–15)
BUN: 14 mg/dL (ref 6–20)
CO2: 25 mmol/L (ref 22–32)
Calcium: 9.4 mg/dL (ref 8.9–10.3)
Chloride: 105 mmol/L (ref 98–111)
Creatinine, Ser: 0.82 mg/dL (ref 0.44–1.00)
GFR, Estimated: >60 mL/min (ref >60)
Glucose, Bld: 92 mg/dL (ref 70–99)
Potassium: 4.2 mmol/L (ref 3.5–5.1)
Sodium: 137 mmol/L (ref 135–145)

## 2021-05-15 LAB — BRAIN NATRIURETIC PEPTIDE: B Natriuretic Peptide: 805 pg/mL — ABNORMAL HIGH (ref 0.0–100.0)

## 2021-05-15 NOTE — Progress Notes (Signed)
Advanced Heart Failure Clinic Note   PCP: Linward Foster, MD PCP-Cardiologist: Chilton Si, MD  HF Cardiologist: Dr. Gala Romney  HPI: 50 y/o AAF w/ h/o HTN and systolic heart failure.    Admitted 6/22 w/ acute HF. Echo 20-25%. She diuresed well w/ IV Lasix. R/LHC showed normal coronaries and mildly increased left-sided filling pressures with mild mixed pulmonary HTN. cMRI EF 23%  Has seen Dr. Jomarie Longs. 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 Genetic testing sent. (8/22)  Today she returns for HF follow up here with her father. Says she feels good. Back in school with 2 classes. 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 Not overly active. SOB with mild activity. No CP, edema, orthopnea or PND. No palpitations. No syncope/presyncope.   Echo today 05/15/21: EF 20-25% RV ok Personally reviewed   Cardiac Studies:   - Echo (6/22): EF 20-25%, LV severely depressed, RV ok  - L/RHC (6/22): Normal cors and mildly increased left-sided filling pressures with mild mixed pulmonary HTN. Ao = 117/78 (96) LV = 118/24 RA = 8 RV = 54/12 PA = 49/22 (34) PCW = 14 Fick cardiac output/index = 4.4/2.5 PVR = 4.6 Ao sat = 98% PA sat = 67%, 70%  - cMRI (6/22): EF 23%, LGE in the left ventricular myocardium: Mid myocardial in the basal anteroseptal and inferoseptal. Mild RV systolic dysfunction (RVEF =39%).      ROS: All systems reviewed and negative except as per HPI.   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 60 tablet 11   dapagliflozin propanediol (FARXIGA) 10 MG TABS tablet Take 1 tablet (10 mg total) by mouth daily before breakfast. 30 tablet 5   furosemide  (LASIX) 40 MG tablet Take 1 tablet (40 mg total) by mouth daily as needed. For leg swelling or weight gain. Take for 3 lb wt gain in 24 hr or 5 lb in 1 week 30 tablet 11   potassium chloride SA (KLOR-CON) 20 MEQ tablet Take 1 tablet (20 mEq total) by mouth daily as needed. Take only when you need to take a lasix tablet 30 tablet 5   sacubitril-valsartan (ENTRESTO) 49-51 MG Take 1 tablet by mouth 2 (two) times daily. 60 tablet 5   spironolactone (ALDACTONE) 25 MG tablet Take 25 mg by mouth daily.     No current facility-administered medications for this encounter.   No Known Allergies  Social History   Socioeconomic History   Marital status: Single    Spouse name: Not on file   Number of children: Not on file   Years of education: Not on file   Highest education level: Not on file  Occupational History   Not on file  Tobacco Use   Smoking status: Never   Smokeless tobacco: Never  Substance and Sexual Activity   Alcohol use: Yes    Comment: 1-2 per week   Drug use: No   Sexual activity: Yes  Other Topics Concern   Not on file  Social History Narrative   Not on file   Social Determinants of Health   Financial Resource Strain: Not on file  Food Insecurity: Not on file  Transportation Needs: Not on file  Physical Activity: Not on file  Stress: Not on file  Social Connections: Not on file  Intimate Partner Violence: Not on file   Family History  Problem Relation Age of Onset   Hypertension Mother    CAD Mother    Prostate cancer Father    Heart failure Maternal Aunt    Heart failure Maternal Aunt    Heart failure Maternal Aunt    Heart failure Maternal Uncle    Anemia Neg Hx    Arrhythmia Neg Hx    Asthma Neg Hx    Clotting disorder Neg Hx    Fainting Neg Hx    Hyperlipidemia Neg Hx    BP 118/76   Pulse (!) 55   Wt 72.8 kg (160 lb 6.4 oz)   SpO2 98%   BMI 30.31 kg/m   Wt Readings from Last 3 Encounters:  05/15/21 72.8 kg (160 lb 6.4 oz)  04/22/21 74.4 kg  (164 lb)  03/25/21 74 kg (163 lb 3.2 oz)   PHYSICAL EXAM: General:  Well appearing. No resp difficulty HEENT: normal Neck: supple. no JVD. Carotids 2+ bilat; no bruits. No lymphadenopathy or thryomegaly appreciated. Cor: PMI nondisplaced. Regular rate & rhythm. No rubs, gallops or murmurs. Lungs: clear Abdomen: soft, nontender, nondistended. No hepatosplenomegaly. No bruits or masses. Good bowel sounds. Extremities: no cyanosis, clubbing, rash, edema Neuro: alert & orientedx3, cranial nerves grossly intact. moves all 4 extremities w/o difficulty. Affect pleasant   ASSESSMENT & PLAN: Chronic systolic Heart Failure (New): - Echo (6/22): EF 20-25%, GIIDD, RV normal. + RWMAs (AK LV, entire inferoseptal wall, apical segment and inferior wall) - R/LHC (6/22): normal cors and mildly increased left-sided filling pressures with mild mixed pulmonary HTN. - cMRI (6/22): Severely reduced systolic dysfunction LVEF 23%, mild RV systolic dysfunction RVEF 39%, breath hold artifact dueing LGE assessment - In process of genetic testing (see below) - Echo today 05/15/21 EF 20-25%. Personally reviewed - NYHA II-early III volume ok  - Continue carvedilol 3.125 mg bid. HR too low to titrate.  - Continue Farxiga 10 mg daily. - Increase Entresto 97/103 mg bid. - Continue spiro 25 mg daily. May be causing loose stools. Hold for 2 days. If improves. Consider eplerenone.  - Takes lasix as needed - Labs today. - I have reviewed genetic pedigree with her and her family. I have also discussed with Dr. Jomarie Longs. Formal genetic testing has been delayed due to insurance denial - now being appealed. 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 We discussed this fact. Will refer for ICD and get CPX to risk stratify for need for advanced therapies.    2. HTN: - BP ok   4. Obesity: - Body mass index is 30.31 kg/m. - Remains acceptable for transplant  5. DM2: - A1c 6.4.  On Farxiga.  Total time spent 40 minutes. Over half that time spent discussing above.    Arvilla Meres, MD 05/15/21

## 2021-05-15 NOTE — Progress Notes (Signed)
Pt's RTW form completed by Dr Gala Romney "pt unable to RTW at this time and will be reevaluated on 08/14/21" form faxed to [employer] at [phone], pt given copy for her records

## 2021-05-15 NOTE — Progress Notes (Signed)
  Echocardiogram 2D Echocardiogram has been performed.  Wendy Stephens 05/15/2021, 2:39 PM

## 2021-05-15 NOTE — Patient Instructions (Addendum)
Continue current medications  Labs done today, your results will be available in MyChart, we will contact you for abnormal readings.  Your physician has recommended that you have a cardiopulmonary stress test (CPX). CPX testing is a non-invasive measurement of heart and lung function. It replaces a traditional treadmill stress test. This type of test provides a tremendous amount of information that relates not only to your present condition but also for future outcomes. This test combines measurements of you ventilation, respiratory gas exchange in the lungs, electrocardiogram (EKG), blood pressure and physical response before, during, and following an exercise protocol.  You have been referred to EP to discuss getting an ICD, they will call you for an appointment  Your physician recommends that you schedule a follow-up appointment in: 3 months  Do the following things EVERYDAY: Weigh yourself in the morning before breakfast. Write it down and keep it in a log. Take your medicines as prescribed Eat low salt foods--Limit salt (sodium) to 2000 mg per day.  Stay as active as you can everyday Limit all fluids for the day to less than 2 liters  If you have any questions or concerns before your next appointment please send [place] a message through [place] or call our office at [phone].    TO LEAVE A MESSAGE FOR THE NURSE SELECT OPTION 2, PLEASE LEAVE A MESSAGE INCLUDING: YOUR NAME DATE OF BIRTH CALL BACK NUMBER REASON FOR CALL**this is important as we prioritize the call backs  YOU WILL RECEIVE A CALL BACK THE SAME DAY AS LONG AS YOU CALL BEFORE 4:00 PM  At the [facility], you and your health needs are our priority. As part of our continuing mission to provide you with exceptional heart care, we have created designated Provider Care Teams. These Care Teams include your primary Cardiologist (physician) and Advanced Practice Providers (APPs- Physician Assistants and Nurse  Practitioners) who all work together to provide you with the care you need, when you need it.   You may see any of the following providers on your designated Care Team at your next follow up: Dr Arvilla Meres Dr Marca Ancona Dr Brandon Melnick, NP Robbie Lis, [place] Mikki Santee Karle Plumber, PharmD   Please be sure to bring in all your medications bottles to every appointment.    Cardioverter Defibrillator Implantation An implantable cardioverter defibrillator (ICD) is a device that identifies and corrects abnormal heart rhythms. Cardioverter defibrillator implantation is a surgery to place an ICD under the skin in the chest or abdomen. An ICD has a battery, a small computer (pulse generator), and wires (leads) that go into the heart. The ICD detects and corrects two types of dangerous irregular heart rhythms (arrhythmias): A rapid heart rhythm in the lower chambers of the heart (ventricles). This is called ventricular tachycardia. The ventricles contracting in an uncoordinated way. This is called ventricular fibrillation. There are different types of ICDs, and the electrical signals from the ICD can be programmed differently based on the condition being treated. The electrical signals from the ICD can be low-energy pulses, high-energy shocks, or a combination of the two. The low-energy pulses are generally used to restore the heartbeat to normal when it is either too slow (bradycardia) or too fast. These pulses are painless. The high-energy shocks are used to treat abnormal rhythms such as ventricular tachycardia or ventricular fibrillation. This shock may feel like a strong jolt in the chest. Your health care provider may recommend an ICD if you have:  Had a ventricular arrhythmia in the past. A damaged heart because of a disease or heart condition. A weakened heart muscle from a heart attack or cardiac arrest. A congenital heart defect. Long QT syndrome, which is a  disorder of the heart's electrical system. Brugada syndrome, which is a condition that causes a disruption of the heart's normal rhythm. Tell a health care provider about: Any allergies you have. All medicines you are taking, including vitamins, herbs, eye drops, creams, and over-the-counter medicines. Any problems you or family members have had with anesthetic medicines. Any blood disorders you have. Any surgeries you have had. Any medical conditions you have. Whether you are pregnant or may be pregnant. What are the risks? Generally, this is a safe procedure. However, problems may occur, including: Infection. Bleeding. Allergic reactions to medicines used during the procedure. Blood clots. Swelling or bruising. Damage to nearby structures or organs, such as nerves, lungs, blood vessels, or the heart where the ICD leads or pulse generator is implanted. What happens before the procedure? Staying hydrated Follow instructions from your health care provider about hydration, which may include: Up to 2 hours before the procedure - you may continue to drink clear liquids, such as water, clear fruit juice, black coffee, and plain tea.  Eating and drinking restrictions Follow instructions from your health care provider about eating and drinking, which may include: 8 hours before the procedure - stop eating heavy meals or foods, such as meat, fried foods, or fatty foods. 6 hours before the procedure - stop eating light meals or foods, such as toast or cereal. 6 hours before the procedure - stop drinking milk or drinks that contain milk. 2 hours before the procedure - stop drinking clear liquids. Medicines Ask your health care provider about: Changing or stopping your regular medicines. This is especially important if you are taking diabetes medicines or blood thinners. Taking medicines such as aspirin and ibuprofen. These medicines can thin your blood. Do not take these medicines unless your  health care provider tells you to take them. Taking over-the-counter medicines, vitamins, herbs, and supplements. Tests You may have an exam or testing. These may include: Blood tests. A test to check the electrical signals in your heart (electrocardiogram, ECG). Imaging tests, such as a chest X-ray. Echocardiogram. This is an ultrasound of your heart to evaluate your heart structures and function. An event monitor or Holter monitor to wear at home. General instructions Do not use any products that contain nicotine or tobacco for at least 4 weeks before the procedure. These products include cigarettes, chewing tobacco, and vaping devices, such as e-cigarettes. If you need help quitting, ask your health care provider. Ask your health care provider: How your procedure site will be marked. What steps will be taken to help prevent infection. These may include: Removing hair at the surgery site. Washing skin with a germ-killing soap. Taking antibiotic medicine. You may be asked to shower with a germ-killing soap. Plan to have a responsible adult take you home from the hospital or clinic. What happens during the procedure?  Small monitors will be put on your body. They will be used to check your heart rate, blood pressure, and oxygen level. A pair of sticky pads (defibrillator pads) may be placed on your back and chest. These pads are able to pace your heart as needed during the procedure. An IV will be inserted into one of your veins. You will be given one or more of the following: A medicine to  help you relax (sedative). A medicine to numb the area (local anesthetic). A medicine to make you fall asleep(general anesthetic). A small incision will be made to create a deep pocket under the skin of your chest or abdomen. Leads will be guided through a blood vessel into your heart and attached to your heart muscles. Depending on the ICD, the leads may go into one ventricle, or they may go into  both ventricles and into an upper chamber of the heart. An X-ray machine (fluoroscope) will be used to help guide the leads. The other end of the leads will be attached to the pulse generator. The pulse generator will be placed into the pocket under the skin. The ICD will be tested, and your health care provider will program the ICD for the condition being treated. The incision will be closed with stitches (sutures), skin glue, adhesive strips, or staples. A bandage (dressing) will be placed over the incision. The procedure may vary among health care providers and hospitals. What happens after the procedure? Your blood pressure, heart rate, breathing rate, and blood oxygen level will be monitored until you leave the hospital or clinic. Your health care provider will also monitor your ICD to make sure it is working properly. A chest X-ray will be taken to check that the ICD is in the right place. Do not raise the arm on the side of your procedure higher than your shoulder for as long as told by your health care provider. This is usually at least 6 weeks. You may be given an identification card explaining that you have an ICD. You will be given a remote home monitoring device to use with your ICD to allow your device to communicate with your clinic. Summary An implantable cardioverter defibrillator (ICD) is a device that identifies and corrects abnormal heart rhythms. Cardioverter defibrillator implantation is a surgery to place an ICD under the skin in the chest or abdomen. An ICD consists of a battery, a small computer (pulse generator), and wires (leads) that go into the heart. During the procedure, the ICD will be tested, and your health care provider will program the ICD for the condition being treated. After the procedure, a chest X-ray will be taken to check that the ICD is in the right place. This information is not intended to replace advice given to you by your health care provider. Make  sure you discuss any questions you have with your health care provider. Document Revised: 02/06/2020 Document Reviewed: 02/06/2020 Elsevier Patient Education  2022 [employer].

## 2021-05-19 ENCOUNTER — Institutional Professional Consult (permissible substitution): Payer: BC Managed Care – PPO | Admitting: Internal Medicine

## 2021-05-19 ENCOUNTER — Other Ambulatory Visit: Payer: Self-pay

## 2021-05-19 ENCOUNTER — Ambulatory Visit (HOSPITAL_COMMUNITY): Payer: BC Managed Care – PPO | Attending: Internal Medicine

## 2021-05-19 DIAGNOSIS — I5042 Chronic combined systolic (congestive) and diastolic (congestive) heart failure: Secondary | ICD-10-CM

## 2021-05-20 ENCOUNTER — Encounter: Payer: Self-pay | Admitting: Internal Medicine

## 2021-05-20 ENCOUNTER — Ambulatory Visit: Payer: BC Managed Care – PPO | Admitting: Internal Medicine

## 2021-05-20 VITALS — BP 88/58 | HR 58 | Ht 61.0 in | Wt 159.8 lb

## 2021-05-20 DIAGNOSIS — I5042 Chronic combined systolic (congestive) and diastolic (congestive) heart failure: Secondary | ICD-10-CM | POA: Diagnosis not present

## 2021-05-20 NOTE — Patient Instructions (Addendum)

## 2021-05-20 NOTE — Progress Notes (Signed)
ELECTROPHYSIOLOGY CONSULT NOTE  Patient ID: Wendy Stephens, MRN: [id number], DOB/AGE: 09/19/1970 50 y.o. Admit date: (Not on file) Date of Consult: 05/20/2021  Primary Physician: Linward Foster, MD Primary Cardiologist: DB     Wendy MONRROY is a 50 y.o. female who is being seen today for the evaluation of ICD at the request of DB.    HPI Wendy Stephens is a 50 y.o. female who presented with heart failure and volume overload 6/22.  She was started on guideline directed therapy and diuresed and underwent evaluation as listed below.  Up titration of guideline therapy has been limited by blood pressure.  At this point she denies dyspnea on exertion nocturnal dyspnea peripheral edema.  She has had no syncope or palpitations.  She was really hopeful that with the paucity of symptoms that she would not need an ICD and comes with a question in mind  I cared for her mom who had a cardiomyopathy and an ICD  Genetic counseling has been undertaken, "indicative of a genetic condition... Genetic testing. Is highly recommended "  DATE TEST EF   6/22 Echo  20-25 %   6/22 LHC 20-25 Normal cors  6/22 cMRI 23% LGE basal septum   Date Cr K Hgb  6/22 1.07  13.6  9/22 0.82 4.2        Past Medical History:  Diagnosis Date   Back pain    Chest pain, unspecified    Chronic combined systolic and diastolic heart failure (HCC) 02/05/2021   Essential hypertension 03/05/2021   Hypertension    Palpitations       Surgical History:  Past Surgical History:  Procedure Laterality Date   MYOMECTOMY  2010   RIGHT/LEFT HEART CATH AND CORONARY ANGIOGRAPHY N/A 02/06/2021   Procedure: RIGHT/LEFT HEART CATH AND CORONARY ANGIOGRAPHY;  Surgeon: Dolores Patty, MD;  Location: [facility];  Service: Cardiovascular;  Laterality: N/A;     Home Meds: Current Meds  Medication Sig   carvedilol (COREG) 3.125 MG tablet Take 1 tablet (3.125 mg total) by mouth 2 (two) times daily.    dapagliflozin propanediol (FARXIGA) 10 MG TABS tablet Take 1 tablet (10 mg total) by mouth daily before breakfast.   furosemide (LASIX) 40 MG tablet Take 1 tablet (40 mg total) by mouth daily as needed. For leg swelling or weight gain. Take for 3 lb wt gain in 24 hr or 5 lb in 1 week   potassium chloride SA (KLOR-CON) 20 MEQ tablet Take 1 tablet (20 mEq total) by mouth daily as needed. Take only when you need to take a lasix tablet   sacubitril-valsartan (ENTRESTO) 49-51 MG Take 1 tablet by mouth 2 (two) times daily.   spironolactone (ALDACTONE) 25 MG tablet Take 25 mg by mouth daily.    Allergies: No Known Allergies  Social History   Socioeconomic History   Marital status: Single    Spouse name: Not on file   Number of children: Not on file   Years of education: Not on file   Highest education level: Not on file  Occupational History   Not on file  Tobacco Use   Smoking status: Never   Smokeless tobacco: Never  Substance and Sexual Activity   Alcohol use: Yes    Comment: 1-2 per week   Drug use: No   Sexual activity: Yes  Other Topics Concern   Not on file  Social History Narrative   Not on file  Social Determinants of Health   Financial Resource Strain: Not on file  Food Insecurity: Not on file  Transportation Needs: Not on file  Physical Activity: Not on file  Stress: Not on file  Social Connections: Not on file  Intimate Partner Violence: Not on file     Family History  Problem Relation Age of Onset   Hypertension Mother    CAD Mother    Prostate cancer Father    Heart failure Maternal Aunt    Heart failure Maternal Aunt    Heart failure Maternal Aunt    Heart failure Maternal Uncle    Anemia Neg Hx    Arrhythmia Neg Hx    Asthma Neg Hx    Clotting disorder Neg Hx    Fainting Neg Hx    Hyperlipidemia Neg Hx      ROS:  Please see the history of present illness.     All oth progress with asystems reviewed and negative.    Physical Exam: Blood  pressure (!) 88/58, pulse (!) 58, height 5\' 1"  (1.549 m), weight 159 lb 12.8 oz (72.5 kg), SpO2 98 %. General: Well developed, well nourished female in no acute distress. Head: Normocephalic, atraumatic, sclera non-icteric, no xanthomas, nares are without discharge. EENT: normal  Lymph Nodes:  none Neck: Negative for carotid bruits. JVD not elevated. Back:without scoliosis kyphosis Lungs: Clear bilaterally to auscultation without wheezes, rales, or rhonchi. Breathing is unlabored. Heart: RRR with S1 S2. No   systolic murmur . No rubs, or gallops appreciated. Abdomen: Soft, non-tender, non-distended with normoactive bowel sounds. No hepatomegaly. No rebound/guarding. No obvious abdominal masses. Msk:  Strength and tone appear normal for age. Extremities: No clubbing or cyanosis. No  edema.  Distal pedal pulses are 2+ and equal bilaterally. Skin: Warm and Dry Neuro: Alert and oriented X 3. CN III-XII intact Grossly normal sensory and motor function . Psych:  Responds to questions appropriately with a normal affect.      Labs: Cardiac Enzymes No results for input(s): CKTOTAL, CKMB, TROPONINI in the last 72 hours. CBC Lab Results  Component Value Date   WBC 5.3 02/06/2021   HGB 13.6 02/06/2021   HGB 13.3 02/06/2021   HCT 40.0 02/06/2021   HCT 39.0 02/06/2021   MCV 83.2 02/06/2021   PLT 262 02/06/2021   PROTIME: No results for input(s): LABPROT, INR in the last 72 hours. Chemistry  Recent Labs  Lab 05/15/21 1610  NA 137  K 4.2  CL 105  CO2 25  BUN 14  CREATININE 0.82  CALCIUM 9.4  GLUCOSE 92   Lipids No results found for: CHOL, HDL, LDLCALC, TRIG BNP No results found for: PROBNP Thyroid Function Tests: No results for input(s): TSH, T4TOTAL, T3FREE, THYROIDAB in the last 72 hours.  Invalid input(s): FREET3 Miscellaneous No results found for: DDIMER  Radiology/Studies:  ECHOCARDIOGRAM COMPLETE  Result Date: 05/15/2021    ECHOCARDIOGRAM REPORT   Patient Name:    Wendy Stephens Date of Exam: 05/15/2021 Medical Rec #:  05/17/2021        Height:       61.0 in Accession #:    [id number]       Weight:       164.0 lb Date of Birth:  16-Oct-1970        BSA:          1.736 m Patient Age:    50 years         BP:  104/65 mmHg Patient Gender: F                HR:           72 bpm. Exam Location:  Outpatient Procedure: 2D Echo Indications:    congestive heart failure  History:        Patient has prior history of Echocardiogram examinations, most                 recent 02/05/2021. Risk Factors:Hypertension.  Sonographer:    Delcie Roch RDCS Referring Phys: [phone] JESSICA M [place] IMPRESSIONS  1. Left ventricular ejection fraction, by estimation, is 20 to 25%. The left ventricle has severely decreased function. The left ventricle has no regional wall motion abnormalities. The left ventricular internal cavity size was mildly dilated. Left ventricular diastolic parameters are consistent with Grade III diastolic dysfunction (restrictive).  2. Right ventricular systolic function is normal. The right ventricular size is normal.  3. Left atrial size was mildly dilated.  4. Right atrial size was mildly dilated.  5. The mitral valve is normal in structure. Mild mitral valve regurgitation. No evidence of mitral stenosis.  6. The aortic valve is normal in structure. Aortic valve regurgitation is not visualized. No aortic stenosis is present.  7. The inferior vena cava is normal in size with greater than 50% respiratory variability, suggesting right atrial pressure of 3 mmHg. FINDINGS  Left Ventricle: Left ventricular ejection fraction, by estimation, is 20 to 25%. The left ventricle has severely decreased function. The left ventricle has no regional wall motion abnormalities. The left ventricular internal cavity size was mildly dilated. There is no left ventricular hypertrophy. Left ventricular diastolic parameters are consistent with Grade III diastolic dysfunction (restrictive).  Right Ventricle: The right ventricular size is normal. No increase in right ventricular wall thickness. Right ventricular systolic function is normal. Left Atrium: Left atrial size was mildly dilated. Right Atrium: Right atrial size was mildly dilated. Pericardium: There is no evidence of pericardial effusion. Mitral Valve: The mitral valve is normal in structure. Mild mitral valve regurgitation. No evidence of mitral valve stenosis. Tricuspid Valve: The tricuspid valve is normal in structure. Tricuspid valve regurgitation is mild . No evidence of tricuspid stenosis. Aortic Valve: The aortic valve is normal in structure. Aortic valve regurgitation is not visualized. No aortic stenosis is present. Pulmonic Valve: The pulmonic valve was normal in structure. Pulmonic valve regurgitation is mild. No evidence of pulmonic stenosis. Aorta: The aortic root is normal in size and structure. Venous: The inferior vena cava is normal in size with greater than 50% respiratory variability, suggesting right atrial pressure of 3 mmHg. IAS/Shunts: No atrial level shunt detected by color flow Doppler.  LEFT VENTRICLE PLAX 2D LVIDd:         4.90 cm      Diastology LVIDs:         4.50 cm      LV e' lateral:   10.40 cm/s LV PW:         1.00 cm      LV E/e' lateral: 10.5 LV IVS:        0.80 cm LVOT diam:     1.70 cm LV SV:         31 LV SV Index:   18 LVOT Area:     2.27 cm  LV Volumes (MOD) LV vol d, MOD A2C: 122.0 ml LV vol d, MOD A4C: 101.0 ml LV vol s, MOD A2C: 80.7 ml LV vol s,  MOD A4C: 68.9 ml LV SV MOD A2C:     41.3 ml LV SV MOD A4C:     101.0 ml LV SV MOD BP:      36.1 ml RIGHT VENTRICLE             IVC RV S prime:     11.20 cm/s  IVC diam: 1.60 cm TAPSE (M-mode): 2.4 cm LEFT ATRIUM             Index       RIGHT ATRIUM           Index LA diam:        3.80 cm 2.19 cm/m  RA Area:     18.80 cm LA Vol (A2C):   66.6 ml 38.36 ml/m RA Volume:   51.70 ml  29.78 ml/m LA Vol (A4C):   56.9 ml 32.78 ml/m LA Biplane Vol: 64.2 ml 36.98  ml/m  AORTIC VALVE LVOT Vmax:   71.00 cm/s LVOT Vmean:  44.700 cm/s LVOT VTI:    0.138 m  AORTA Ao Root diam: 2.20 cm Ao Asc diam:  2.80 cm MITRAL VALVE MV Area (PHT): 4.89 cm     SHUNTS MV Decel Time: 155 msec     Systemic VTI:  0.14 m MV E velocity: 109.00 cm/s  Systemic Diam: 1.70 cm MV A velocity: 58.90 cm/s MV E/A ratio:  1.85 Arvilla Meres MD Electronically signed by Arvilla Meres MD Signature Date/Time: 05/15/2021/3:25:01 PM    Final     EKG: Sinus at 58 Interval 21/08/44 T wave inversions 1 to V3 through V6 RSR prime S prime in lead I and lead aVL, aVR and aVF isolated PVC   Assessment and Plan:  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  Moreover, and more concerningly perhaps, it is her family history identifies a high risk of premature death in women with with a nonischemic cardiomyopathy strongly suggestive of a genetic disorder with a malignant presentation.  Hence, it would be reasonable to consider even more strongly ICD implantation.  The fractionated QRS is also concerning for malignant presentation  We had a lengthy discussion regarding risk of sudden death in patients with nonischemic cardiomyopathy not withstanding minimal symptoms i.e. class II.  I have reviewed with Dr. Dorthea Cove CPX and the genetic report.  The latter 2 came in after the patient left and I will follow-up with the patient again in the morning regarding a stronger recommendation to proceed with ICD implantation.  Spoke with Dr. Gala Romney about the patient as noted above and then chatted with the patient again.  She is agreeable  to proceeding with an ICD.  Extensive discussion regarding ICD versus transvenous versus subcutaneous.  She will do some research on her own and let us know.        Sherryl Manges There is a high

## 2021-06-01 ENCOUNTER — Telehealth: Payer: Self-pay

## 2021-06-01 DIAGNOSIS — I428 Other cardiomyopathies: Secondary | ICD-10-CM

## 2021-06-01 DIAGNOSIS — I5042 Chronic combined systolic (congestive) and diastolic (congestive) heart failure: Secondary | ICD-10-CM

## 2021-06-01 DIAGNOSIS — Z01812 Encounter for preprocedural laboratory examination: Secondary | ICD-10-CM

## 2021-06-01 NOTE — Telephone Encounter (Signed)
Spoke with pt and reviewed device implant instructions.  See letter for complete details.  Pt verbalizes understanding and agrees with current plan.

## 2021-06-01 NOTE — Telephone Encounter (Signed)
Spoke with pt who would like to schedule  Transvenous ICD implant for 07/22/2021.  Pt requesting [employer] as vendor if possible due to her research.  Pt advised once procedure is scheduled will send instruction sheet to her MyChart and will call to review instructions.  Pt verbalizes understanding and agrees with current plan.

## 2021-06-07 ENCOUNTER — Encounter (HOSPITAL_COMMUNITY): Payer: Self-pay

## 2021-06-09 NOTE — Telephone Encounter (Signed)
Pt called and notified Wendy Stephens will work on her paperwork today.

## 2021-06-12 ENCOUNTER — Telehealth (HOSPITAL_COMMUNITY): Payer: Self-pay | Admitting: Internal Medicine

## 2021-06-12 NOTE — Telephone Encounter (Signed)
Returned call to Dana-LM[facility] @ 1430  -clarification needed as to what additional information is required to keep her claim open.

## 2021-06-12 NOTE — Telephone Encounter (Signed)
Forms updated and refaxed to [employer]p [phone]

## 2021-06-12 NOTE — Telephone Encounter (Signed)
Wendy Stephens [phone] Ext [phone]) Rep for pt has been leaving messages for completed  disability forms, and office notes from 05/15/21, Rep sent forms to be completed again on 10/20 because they were not filled out completely the first time, Pt will loose benefits if not completed today, please advise

## 2021-07-02 ENCOUNTER — Encounter (HOSPITAL_COMMUNITY): Payer: Self-pay

## 2021-07-02 ENCOUNTER — Other Ambulatory Visit (HOSPITAL_COMMUNITY): Payer: Self-pay | Admitting: *Deleted

## 2021-07-02 MED ORDER — SPIRONOLACTONE 25 MG PO TABS: 25.0000 mg | ORAL_TABLET | Freq: Every day | ORAL | 11 refills | Status: DC

## 2021-07-13 ENCOUNTER — Encounter: Payer: Self-pay | Admitting: Internal Medicine

## 2021-07-13 DIAGNOSIS — I10 Essential (primary) hypertension: Secondary | ICD-10-CM

## 2021-07-13 DIAGNOSIS — Z01812 Encounter for preprocedural laboratory examination: Secondary | ICD-10-CM

## 2021-07-13 DIAGNOSIS — I429 Cardiomyopathy, unspecified: Secondary | ICD-10-CM

## 2021-07-13 DIAGNOSIS — I5042 Chronic combined systolic (congestive) and diastolic (congestive) heart failure: Secondary | ICD-10-CM

## 2021-07-20 ENCOUNTER — Other Ambulatory Visit: Payer: BC Managed Care – PPO | Admitting: *Deleted

## 2021-07-20 ENCOUNTER — Other Ambulatory Visit: Payer: Self-pay

## 2021-07-20 DIAGNOSIS — I428 Other cardiomyopathies: Secondary | ICD-10-CM

## 2021-07-20 DIAGNOSIS — Z0279 Encounter for issue of other medical certificate: Secondary | ICD-10-CM

## 2021-07-20 DIAGNOSIS — Z01812 Encounter for preprocedural laboratory examination: Secondary | ICD-10-CM

## 2021-07-20 DIAGNOSIS — I5042 Chronic combined systolic (congestive) and diastolic (congestive) heart failure: Secondary | ICD-10-CM

## 2021-07-20 LAB — BASIC METABOLIC PANEL
BUN/Creatinine Ratio: 26 — ABNORMAL HIGH (ref 9–23)
BUN: 22 mg/dL (ref 6–24)
CO2: 29 mmol/L (ref 20–29)
Calcium: 9.6 mg/dL (ref 8.7–10.2)
Chloride: 102 mmol/L (ref 96–106)
Creatinine, Ser: 0.84 mg/dL (ref 0.57–1.00)
Glucose: 93 mg/dL (ref 70–99)
Potassium: 4.5 mmol/L (ref 3.5–5.2)
Sodium: 137 mmol/L (ref 134–144)
eGFR: 85 mL/min/{1.73_m2} (ref >59)

## 2021-07-20 LAB — CBC
Hematocrit: 38.9 % (ref 34.0–46.6)
Hemoglobin: 13.6 g/dL (ref 11.1–15.9)
MCH: 28.2 pg (ref 26.6–33.0)
MCHC: 35 g/dL (ref 31.5–35.7)
MCV: 81 fL (ref 79–97)
Platelets: 209 10*3/uL (ref 150–450)
RBC: 4.83 x10E6/uL (ref 3.77–5.28)
RDW: 14.9 % (ref 11.7–15.4)
WBC: 5.2 10*3/uL (ref 3.4–10.8)

## 2021-07-28 ENCOUNTER — Telehealth: Payer: Self-pay

## 2021-07-28 NOTE — Telephone Encounter (Signed)
Spoke with pt and advised due to schedule change, pt will need to arrive at [facility] on 08/19/2021 at 12 noon.  Pt verbalizes understanding and agrees with current plan.

## 2021-07-29 ENCOUNTER — Encounter (HOSPITAL_COMMUNITY): Payer: Self-pay | Admitting: Internal Medicine

## 2021-08-05 ENCOUNTER — Ambulatory Visit: Payer: BC Managed Care – PPO

## 2021-08-06 ENCOUNTER — Telehealth: Payer: Self-pay | Admitting: Internal Medicine

## 2021-08-06 ENCOUNTER — Other Ambulatory Visit: Payer: Self-pay | Admitting: Cardiology

## 2021-08-06 NOTE — Telephone Encounter (Signed)
Forms form Wendy Stephens received on 07/20/2021. Completed patient auth attached. Took form to Dr. Graciela Husbands box for completion. 08/06/2021 JMM

## 2021-08-14 ENCOUNTER — Encounter (HOSPITAL_COMMUNITY): Payer: BC Managed Care – PPO | Admitting: Internal Medicine

## 2021-08-14 ENCOUNTER — Encounter (HOSPITAL_COMMUNITY): Payer: Self-pay | Admitting: *Deleted

## 2021-08-14 NOTE — Progress Notes (Signed)
Pt's disability forms completed, signed by Dr Gala Romney, and faxed to [employer] at [phone]

## 2021-08-18 ENCOUNTER — Other Ambulatory Visit: Payer: Self-pay

## 2021-08-18 ENCOUNTER — Other Ambulatory Visit: Payer: BC Managed Care – PPO | Admitting: *Deleted

## 2021-08-18 DIAGNOSIS — Z01812 Encounter for preprocedural laboratory examination: Secondary | ICD-10-CM

## 2021-08-18 DIAGNOSIS — I5042 Chronic combined systolic (congestive) and diastolic (congestive) heart failure: Secondary | ICD-10-CM

## 2021-08-18 DIAGNOSIS — I10 Essential (primary) hypertension: Secondary | ICD-10-CM

## 2021-08-18 DIAGNOSIS — I429 Cardiomyopathy, unspecified: Secondary | ICD-10-CM

## 2021-08-18 LAB — CBC
Hematocrit: 37.4 % (ref 34.0–46.6)
Hemoglobin: 13 g/dL (ref 11.1–15.9)
MCH: 27.7 pg (ref 26.6–33.0)
MCHC: 34.8 g/dL (ref 31.5–35.7)
MCV: 80 fL (ref 79–97)
Platelets: 238 10*3/uL (ref 150–450)
RBC: 4.7 x10E6/uL (ref 3.77–5.28)
RDW: 12.2 % (ref 11.7–15.4)
WBC: 4.6 10*3/uL (ref 3.4–10.8)

## 2021-08-18 LAB — BASIC METABOLIC PANEL
BUN/Creatinine Ratio: 23 (ref 9–23)
BUN: 19 mg/dL (ref 6–24)
CO2: 23 mmol/L (ref 20–29)
Calcium: 9.2 mg/dL (ref 8.7–10.2)
Chloride: 104 mmol/L (ref 96–106)
Creatinine, Ser: 0.84 mg/dL (ref 0.57–1.00)
Glucose: 80 mg/dL (ref 70–99)
Potassium: 4.4 mmol/L (ref 3.5–5.2)
Sodium: 140 mmol/L (ref 134–144)
eGFR: 85 mL/min/{1.73_m2} (ref >59)

## 2021-08-18 NOTE — Pre-Procedure Instructions (Signed)
Attempted to call patient no answer

## 2021-08-19 ENCOUNTER — Ambulatory Visit (HOSPITAL_COMMUNITY): Payer: BC Managed Care – PPO

## 2021-08-19 ENCOUNTER — Ambulatory Visit (HOSPITAL_COMMUNITY)
Admission: RE | Disposition: A | Discharge status: Home / Self Care | Payer: Self-pay | Source: Home / Self Care | Attending: Internal Medicine

## 2021-08-19 ENCOUNTER — Ambulatory Visit (HOSPITAL_COMMUNITY)
Admission: RE | Admit: 2021-08-19 | Discharge: 2021-08-19 | Disposition: A | Discharge status: Home / Self Care | Payer: BC Managed Care – PPO | Attending: Internal Medicine | Admitting: Internal Medicine

## 2021-08-19 ENCOUNTER — Encounter (HOSPITAL_COMMUNITY): Payer: Self-pay | Admitting: Internal Medicine

## 2021-08-19 DIAGNOSIS — I110 Hypertensive heart disease with heart failure: Secondary | ICD-10-CM | POA: Insufficient documentation

## 2021-08-19 DIAGNOSIS — Z959 Presence of cardiac and vascular implant and graft, unspecified: Secondary | ICD-10-CM

## 2021-08-19 DIAGNOSIS — I428 Other cardiomyopathies: Secondary | ICD-10-CM | POA: Insufficient documentation

## 2021-08-19 DIAGNOSIS — I5022 Chronic systolic (congestive) heart failure: Secondary | ICD-10-CM | POA: Diagnosis not present

## 2021-08-19 HISTORY — DX: Chronic systolic (congestive) heart failure: I50.22

## 2021-08-19 HISTORY — DX: Other cardiomyopathies: I42.8

## 2021-08-19 HISTORY — PX: ICD IMPLANT: EP1208

## 2021-08-19 LAB — PREGNANCY, URINE: Preg Test, Ur: NEGATIVE

## 2021-08-19 SURGERY — ICD IMPLANT

## 2021-08-19 MED ORDER — FENTANYL CITRATE (PF) 100 MCG/2ML IJ SOLN
INTRAMUSCULAR | Status: DC | PRN
  Administered 2021-08-19 (×3): 25 ug via INTRAVENOUS
  Administered 2021-08-19: 50 ug via INTRAVENOUS
  Administered 2021-08-19: 25 ug via INTRAVENOUS

## 2021-08-19 MED ORDER — HEPARIN (PORCINE) IN NACL 1000-0.9 UT/500ML-% IV SOLN
INTRAVENOUS | Status: DC | PRN
  Administered 2021-08-19: 500 mL

## 2021-08-19 MED ORDER — HEPARIN (PORCINE) IN NACL 1000-0.9 UT/500ML-% IV SOLN
INTRAVENOUS | Status: AC
  Filled 2021-08-19: qty 500

## 2021-08-19 MED ORDER — IOHEXOL 350 MG/ML SOLN
90.0000 mL | Freq: Once | INTRAVENOUS | Status: AC | PRN
  Administered 2021-08-19: 17:00:00 90 mL via INTRAVENOUS

## 2021-08-19 MED ORDER — SODIUM CHLORIDE 0.9 % IV SOLN: INTRAVENOUS | Status: DC

## 2021-08-19 MED ORDER — ONDANSETRON HCL 4 MG/2ML IJ SOLN: 4.0000 mg | Freq: Four times a day (QID) | INTRAMUSCULAR | Status: DC | PRN

## 2021-08-19 MED ORDER — FENTANYL CITRATE (PF) 100 MCG/2ML IJ SOLN
INTRAMUSCULAR | Status: AC
  Filled 2021-08-19: qty 2

## 2021-08-19 MED ORDER — ACETAMINOPHEN 325 MG PO TABS: 325.0000 mg | ORAL_TABLET | ORAL | Status: DC | PRN

## 2021-08-19 MED ORDER — LIDOCAINE HCL (PF) 1 % IJ SOLN
INTRAMUSCULAR | Status: AC
  Filled 2021-08-19: qty 60

## 2021-08-19 MED ORDER — CEFAZOLIN SODIUM-DEXTROSE 2-4 GM/100ML-% IV SOLN
2.0000 g | INTRAVENOUS | Status: AC
  Administered 2021-08-19: 12:00:00 2 g via INTRAVENOUS

## 2021-08-19 MED ORDER — MIDAZOLAM HCL 5 MG/5ML IJ SOLN
INTRAMUSCULAR | Status: AC
  Filled 2021-08-19: qty 5

## 2021-08-19 MED ORDER — IOHEXOL 350 MG/ML SOLN
INTRAVENOUS | Status: DC | PRN
  Administered 2021-08-19: 12:00:00 15 mL

## 2021-08-19 MED ORDER — POVIDONE-IODINE 10 % EX SWAB
2.0000 "application " | Freq: Once | CUTANEOUS | Status: AC
  Administered 2021-08-19: 2 via TOPICAL

## 2021-08-19 MED ORDER — CHLORHEXIDINE GLUCONATE 4 % EX LIQD
4.0000 "application " | Freq: Once | CUTANEOUS | Status: DC
  Filled 2021-08-19: qty 60

## 2021-08-19 MED ORDER — SODIUM CHLORIDE 0.9 % IV SOLN
80.0000 mg | INTRAVENOUS | Status: AC
  Administered 2021-08-19: 13:00:00 80 mg

## 2021-08-19 MED ORDER — LIDOCAINE HCL (PF) 1 % IJ SOLN
INTRAMUSCULAR | Status: DC | PRN
  Administered 2021-08-19: 60 mL

## 2021-08-19 MED ORDER — MIDAZOLAM HCL 5 MG/5ML IJ SOLN
INTRAMUSCULAR | Status: DC | PRN
  Administered 2021-08-19: 1 mg via INTRAVENOUS
  Administered 2021-08-19: 2 mg via INTRAVENOUS
  Administered 2021-08-19 (×3): 1 mg via INTRAVENOUS

## 2021-08-19 MED ORDER — CEFAZOLIN SODIUM-DEXTROSE 2-4 GM/100ML-% IV SOLN
INTRAVENOUS | Status: AC
  Filled 2021-08-19: qty 100

## 2021-08-19 MED ORDER — SODIUM CHLORIDE 0.9 % IV SOLN
INTRAVENOUS | Status: AC
  Filled 2021-08-19: qty 2

## 2021-08-19 SURGICAL SUPPLY — 10 items
CABLE SURGICAL S-101-97-12 (CABLE) ×3 IMPLANT
HEMOSTAT SURGICEL 2X4 FIBR (HEMOSTASIS) ×2 IMPLANT
ICD MOMENTUM X4 CRT-D G138 (ICD Generator) ×2 IMPLANT
LEAD RELIANCE 0137-59 (Lead) ×2 IMPLANT
PAD DEFIB RADIO PHYSIO CONN (PAD) ×3 IMPLANT
PIN PLUG IS-1 DEFIB (PIN) ×4 IMPLANT
SET INTRODUCER MICROPUNCT 5F (INTRODUCER) ×2 IMPLANT
SHEATH 9.5FR PRELUDE SNAP 13 (SHEATH) ×2 IMPLANT
TRAY PACEMAKER INSERTION (PACKS) ×3 IMPLANT
WIRE MICRO SET 5FR 12 (WIRE) IMPLANT

## 2021-08-19 NOTE — Telephone Encounter (Signed)
Noted  

## 2021-08-19 NOTE — Progress Notes (Signed)
Patient needs a 20 gauge in R forearm or AC for CT Angiogram. IV team unsuccessful. Graciela Husbands, MD notified and stated that the L arm can be used.

## 2021-08-19 NOTE — H&P (Signed)
Patient Care Team: Linward Foster, MD as PCP - General (Internal Medicine) Chilton Si, MD as PCP - Cardiology (Cardiology) Bensimhon, Bevelyn Buckles, MD as PCP - Advanced Heart Failure (Cardiology)   HPI  Wendy Stephens is a 50 y.o. 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  There have been no palpitations, lightheadedness or syncope.  Complains of intermittent mild edema and mild shortness of breath but climbing stairs without difficulty.   DATE TEST EF    6/22 Echo  20-25 %    6/22 LHC 20-25 Normal cors  6/22 cMRI 23% LGE basal septum  9/22 Echo   20-25%     Date Cr K Hgb  6/22 1.07   13.6  9/22 0.82 4.2     Past Medical History:  Diagnosis Date   Back pain    Chronic systolic heart failure (HCC)    Essential hypertension 03/05/2021   Hypertension    Nonischemic cardiomyopathy (HCC)    probably genetic   Palpitations       Past Surgical History:  Procedure Laterality Date   MYOMECTOMY  2010   RIGHT/LEFT HEART CATH AND CORONARY ANGIOGRAPHY N/A 02/06/2021   Procedure: RIGHT/LEFT HEART CATH AND CORONARY ANGIOGRAPHY;  Surgeon: Dolores Patty, MD;  Location: [facility] INVASIVE CV LAB;  Service: Cardiovascular;  Laterality: N/A;    Current Facility-Administered Medications  Medication Dose Route Frequency Provider Last Rate Last Admin   0.9 %  sodium chloride infusion   Intravenous Continuous Duke Salvia, MD 50 mL/hr at 08/19/21 1049 New Bag at 08/19/21 1049   0.9 %  sodium chloride infusion   Intravenous Continuous Duke Salvia, MD 50 mL/hr at 08/19/21 1048 New Bag at 08/19/21 1048   ceFAZolin (ANCEF) IVPB 2g/100 mL premix  2 g Intravenous On Call Duke Salvia, MD       chlorhexidine (HIBICLENS) 4 % liquid 4 application  4 application Topical Once Duke Salvia, MD       gentamicin (GARAMYCIN) 80 mg in sodium chloride 0.9 % 500 mL irrigation  80 mg Irrigation On Call Duke Salvia, MD        Allergies  Allergen Reactions   Tape Dermatitis    Pt states she is allergic to the clear tape that was applied to her wrist after her heart cath      Social History   Tobacco Use   Smoking status: Never   Smokeless tobacco: Never  Substance Use Topics   Alcohol use: Yes    Comment: 1-2 per week   Drug use: No     Family History  Problem Relation Age of Onset   Hypertension Mother    CAD Mother    Prostate cancer Father    Heart failure Maternal Aunt    Heart failure Maternal Aunt    Heart failure Maternal Aunt    Heart failure Maternal Uncle    Anemia Neg Hx    Arrhythmia Neg Hx    Asthma Neg Hx    Clotting disorder Neg Hx    Fainting Neg Hx    Hyperlipidemia Neg Hx      Current Meds  Medication Sig   carvedilol (COREG) 3.125 MG tablet Take 1 tablet (3.125 mg total) by mouth 2 (two) times daily.   ENTRESTO 49-51 MG  Take 1 tablet by mouth twice daily   FARXIGA 10 MG TABS tablet TAKE 1 TABLET BY MOUTH ONCE DAILY BEFORE BREAKFAST   furosemide (LASIX) 40 MG tablet Take 1 tablet (40 mg total) by mouth daily as needed. For leg swelling or weight gain. Take for 3 lb wt gain in 24 hr or 5 lb in 1 week   Multiple Vitamins-Minerals (MULTIVITAMIN WITH MINERALS) tablet Take 1 tablet by mouth daily.   sodium chloride (OCEAN) 0.65 % SOLN nasal spray Place 1 spray into both nostrils daily.   spironolactone (ALDACTONE) 25 MG tablet Take 1 tablet (25 mg total) by mouth daily.   [DISCONTINUED] dapagliflozin propanediol (FARXIGA) 10 MG TABS tablet Take 1 tablet (10 mg total) by mouth daily before breakfast.   [DISCONTINUED] sacubitril-valsartan (ENTRESTO) 49-51 MG Take 1 tablet by mouth 2 (two) times daily.     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 female in the office  8/22 and again today referred by Dr DB for consideration of ICD implant for primary prevention of sudden death.  The patient's chart has been reviewed and they meet criteria for ICD implant.  I have had a thorough discussion with the patient reviewing options.  The patient has had and  had opportunities to ask questions and have them answered. The patient and I have decided together through a shared decision making process to proceed with implantingan  ICD at this time.  Risks, benefits, alternatives to ICD implantation were discussed in detail with the patient today. The patient  understands that the risks include but are not limited to bleeding, infection, pneumothorax, perforation, tamponade, vascular damage, renal failure, MI, stroke, death, inappropriate shocks, and lead dislodgement and   wishes to proceed.  We will therefore schedule device implantation at the next available time.

## 2021-08-19 NOTE — Progress Notes (Signed)
CXR and CT results called to Graciela Husbands, MD who stated patient was ok to be D/C'd.

## 2021-08-20 ENCOUNTER — Encounter (HOSPITAL_COMMUNITY): Payer: Self-pay | Admitting: Internal Medicine

## 2021-08-20 ENCOUNTER — Telehealth: Payer: Self-pay

## 2021-08-20 MED FILL — Lidocaine HCl Local Preservative Free (PF) Inj 1%: INTRAMUSCULAR | Qty: 30 | Status: AC

## 2021-08-20 NOTE — Telephone Encounter (Signed)
-----   Message from Graciella Freer, [place] sent at 08/19/2021  4:04 PM EST ----- Same day ICD SK 08/19/21

## 2021-08-20 NOTE — Telephone Encounter (Signed)
Follow-up after same day discharge: Implant date: 08/19/21 MD: Sherryl Manges, MD Device: ICD (CRT device with atrial and LV port plug) [employer] Location: L chest   Wound check visit: 09/02/21 11:30 90 day MD follow-up: 11/23/21 3:00  Remote Transmission received:yes  Dressing removed: NA  Successful telephone encounter to patient to follow up post ICD implant yesterday. Patient states she is doing very well. Dermabond remains intact. Denies swelling, redness, or drainage at implant site. Minimal pain. Arm and driving restrictions reinforced. Patient is provided device clinic contact if any additional questions or concerns should arise. Appointment's confirmed.

## 2021-08-21 ENCOUNTER — Telehealth: Payer: Self-pay

## 2021-08-21 NOTE — Telephone Encounter (Signed)
Transmission received, normal device function.

## 2021-08-21 NOTE — Telephone Encounter (Signed)
The patient states she yank the cover off and tapped her ICD. I assured her that the ICD is ok. The area is going to be tender because she just got implanted. I told her she can send a transmission for the nurse to review. I told her as long as it looks good the nurse will not call her. If she do see anything the nurse will call. I told her no news is good news with [place]. The patient verbalized understanding and thanked me for the call.

## 2021-08-25 ENCOUNTER — Telehealth: Payer: Self-pay | Admitting: Internal Medicine

## 2021-08-25 NOTE — Telephone Encounter (Signed)
Form completed by Dr. Graciela Husbands and faxed to [facility] on 08/25/2021. Patient has been notified to pick up paper work. 08/25/2021 JMM

## 2021-08-28 ENCOUNTER — Encounter: Payer: Self-pay | Admitting: Internal Medicine

## 2021-08-28 ENCOUNTER — Telehealth: Payer: Self-pay

## 2021-08-28 NOTE — Telephone Encounter (Signed)
Patient called stating she is having bleeding from her incision site and is concerned. Patient got implanted on 08/19/2021.

## 2021-08-28 NOTE — Telephone Encounter (Signed)
Spoke with pt regarding ICD incision.  Device implanted 08/19/21.  She states there is a small bubble in the glue over her incision.  This morning she noted a drop of blood dripped out of the bubble.  The site is not actively bleeding.  There is no redness or swelling around the site.  The patient will place a band aid over the bubbled area to monitor for any continuous bleeding.  Patient was able to send mychart message with picture. Bubble noted in blue circle   Patient is scheduled for wound check visit on 09/02/21.    Advised will forward to Dr. Graciela Husbands, anticipate continued monitoring.

## 2021-09-01 ENCOUNTER — Encounter (HOSPITAL_COMMUNITY): Payer: Self-pay | Admitting: *Deleted

## 2021-09-01 NOTE — Progress Notes (Signed)
Received signed ROI from [employer] request last 2 OV notes and any testing results from June 2022 to present. Records faxed back to them at [phone]

## 2021-09-02 ENCOUNTER — Other Ambulatory Visit: Payer: Self-pay

## 2021-09-02 ENCOUNTER — Ambulatory Visit (INDEPENDENT_AMBULATORY_CARE_PROVIDER_SITE_OTHER): Payer: BC Managed Care – PPO

## 2021-09-02 DIAGNOSIS — I5042 Chronic combined systolic (congestive) and diastolic (congestive) heart failure: Secondary | ICD-10-CM

## 2021-09-02 LAB — CUP PACEART INCLINIC DEVICE CHECK
Brady Statistic RV Percent Paced: 1 % — CL
Date Time Interrogation Session: 20230111141406
HighPow Impedance: 56 Ohm
Implantable Lead Implant Date: 20221228
Implantable Lead Location: 753860
Implantable Lead Model: 137
Implantable Lead Serial Number: 301300
Implantable Pulse Generator Implant Date: 20221228
Lead Channel Impedance Value: 3000 Ohm
Lead Channel Impedance Value: 3000 Ohm
Lead Channel Impedance Value: 540 Ohm
Lead Channel Pacing Threshold Amplitude: 0.6 V
Lead Channel Pacing Threshold Pulse Width: 0.4 ms
Lead Channel Sensing Intrinsic Amplitude: 20.9 mV
Lead Channel Setting Pacing Amplitude: 2 V
Lead Channel Setting Pacing Pulse Width: 0.4 ms
Lead Channel Setting Sensing Sensitivity: 0.5 mV
Lead Channel Setting Sensing Sensitivity: 1 mV
Pulse Gen Serial Number: 390417

## 2021-09-02 NOTE — Patient Instructions (Signed)
° °  After Your ICD (Implantable Cardiac Defibrillator)    Monitor your defibrillator site for redness, swelling, and drainage. Call the device clinic at [phone] if you experience these symptoms or fever/chills.  Keep your incision/dressing clean and dry. Do not allow water on site until cleared at next wound visit.   You may use a hot tub or a pool after your wound check appointment if the incision is completely closed.  Do not lift, push or pull greater than 10 pounds with the affected arm until 6 weeks after your procedure. There are no other restrictions in arm movement after your wound check appointment.  Your ICD is designed to protect you from life threatening heart rhythms. Because of this, you may receive a shock.   1 shock with no symptoms:  Call the office during business hours. 1 shock with symptoms (chest pain, chest pressure, dizziness, lightheadedness, shortness of breath, overall feeling unwell):  Call 911. If you experience 2 or more shocks in 24 hours:  Call 911. If you receive a shock, you should not drive.  [place] DMV - no driving for 6 months if you receive appropriate therapy from your ICD.   ICD Alerts:  Some alerts are vibratory and others beep. These are NOT emergencies. Please call our office to let us know. If this occurs at night or on weekends, it can wait until the next business day. Send a remote transmission.  If your device is capable of reading fluid status (for heart failure), you will be offered monthly monitoring to review this with you.   Remote monitoring is used to monitor your ICD from home. This monitoring is scheduled every 91 days by our office. It allows [place] to keep an eye on the functioning of your device to ensure it is working properly. You will routinely see your Electrophysiologist annually (more often if necessary).

## 2021-09-02 NOTE — Progress Notes (Signed)
Wound check appointment. Dermabond removed. Wound without redness or edema. Incision edges approximated with exception at end of incision.  Steri-strips applied and patient will come back in 1 week for wound recheck. Normal device function. Thresholds, sensing, and impedances consistent with implant measurements. Device programmed at 3.5V for extra safety margin until 3 month visit. Histogram distribution appropriate for patient and level of activity. No ventricular arrhythmias noted. Patient educated about wound care, arm mobility, lifting restrictions, shock plan. ROV in 3 months with implanting physician.

## 2021-09-04 ENCOUNTER — Other Ambulatory Visit: Payer: Self-pay | Admitting: Cardiology

## 2021-09-09 ENCOUNTER — Other Ambulatory Visit: Payer: Self-pay

## 2021-09-09 ENCOUNTER — Ambulatory Visit (INDEPENDENT_AMBULATORY_CARE_PROVIDER_SITE_OTHER): Payer: BC Managed Care – PPO

## 2021-09-09 DIAGNOSIS — I429 Cardiomyopathy, unspecified: Secondary | ICD-10-CM

## 2021-09-09 NOTE — Patient Instructions (Signed)
° °  After Your ICD (Implantable Cardiac Defibrillator)   Please continue to follow all restrictions provided in your after visit summary from last weeks visit   Monitor your defibrillator site for redness, swelling, and drainage. Call the device clinic at [phone] if you experience these symptoms or fever/chills.  Your incision was closed with Steri-strips or staples:  You may shower and wash your incision with soap and water. Avoid lotions, ointments, or perfumes over your incision until it is well-healed.  You may use a hot tub or a pool after your wound check appointment if the incision is completely closed.    Your ICD is designed to protect you from life threatening heart rhythms. Because of this, you may receive a shock.   1 shock with no symptoms:  Call the office during business hours. 1 shock with symptoms (chest pain, chest pressure, dizziness, lightheadedness, shortness of breath, overall feeling unwell):  Call 911. If you experience 2 or more shocks in 24 hours:  Call 911. If you receive a shock, you should not drive.   DMV - no driving for 6 months if you receive appropriate therapy from your ICD.   ICD Alerts:  Some alerts are vibratory and others beep. These are NOT emergencies. Please call our office to let us know. If this occurs at night or on weekends, it can wait until the next business day. Send a remote transmission.

## 2021-09-09 NOTE — Progress Notes (Signed)
Follow up wound check appointment. Steri-strips removed from medial end. Wound without redness or edema. Incision completely healed. Instructions on care provided including no lotions, ointments, or perfumes over incision. Patient aware she may shower after today's appointment. She is provided device clinic contact if any additional questions or concerns arise.

## 2021-09-15 ENCOUNTER — Encounter (HOSPITAL_COMMUNITY): Payer: Self-pay | Admitting: Internal Medicine

## 2021-09-17 ENCOUNTER — Encounter (HOSPITAL_COMMUNITY): Payer: Self-pay

## 2021-09-28 ENCOUNTER — Encounter (HOSPITAL_COMMUNITY): Payer: Self-pay | Admitting: Internal Medicine

## 2021-09-28 ENCOUNTER — Other Ambulatory Visit: Payer: Self-pay

## 2021-09-28 ENCOUNTER — Ambulatory Visit (HOSPITAL_COMMUNITY)
Admission: RE | Admit: 2021-09-28 | Discharge: 2021-09-28 | Disposition: A | Discharge status: Home / Self Care | Payer: 59 | Source: Ambulatory Visit | Attending: Internal Medicine | Admitting: Internal Medicine

## 2021-09-28 VITALS — BP 130/80 | HR 65 | Wt 165.8 lb

## 2021-09-28 DIAGNOSIS — I272 Pulmonary hypertension, unspecified: Secondary | ICD-10-CM | POA: Diagnosis not present

## 2021-09-28 DIAGNOSIS — I5022 Chronic systolic (congestive) heart failure: Secondary | ICD-10-CM | POA: Insufficient documentation

## 2021-09-28 DIAGNOSIS — R0683 Snoring: Secondary | ICD-10-CM | POA: Diagnosis not present

## 2021-09-28 DIAGNOSIS — Z7984 Long term (current) use of oral hypoglycemic drugs: Secondary | ICD-10-CM | POA: Diagnosis not present

## 2021-09-28 DIAGNOSIS — Z79899 Other long term (current) drug therapy: Secondary | ICD-10-CM | POA: Insufficient documentation

## 2021-09-28 DIAGNOSIS — Z8249 Family history of ischemic heart disease and other diseases of the circulatory system: Secondary | ICD-10-CM | POA: Insufficient documentation

## 2021-09-28 DIAGNOSIS — Z7182 Exercise counseling: Secondary | ICD-10-CM | POA: Diagnosis not present

## 2021-09-28 DIAGNOSIS — Z6831 Body mass index (BMI) 31.0-31.9, adult: Secondary | ICD-10-CM | POA: Insufficient documentation

## 2021-09-28 DIAGNOSIS — I5042 Chronic combined systolic (congestive) and diastolic (congestive) heart failure: Secondary | ICD-10-CM

## 2021-09-28 DIAGNOSIS — Z95810 Presence of automatic (implantable) cardiac defibrillator: Secondary | ICD-10-CM | POA: Diagnosis not present

## 2021-09-28 DIAGNOSIS — E669 Obesity, unspecified: Secondary | ICD-10-CM | POA: Diagnosis not present

## 2021-09-28 DIAGNOSIS — R7303 Prediabetes: Secondary | ICD-10-CM | POA: Diagnosis not present

## 2021-09-28 DIAGNOSIS — I10 Essential (primary) hypertension: Secondary | ICD-10-CM | POA: Diagnosis not present

## 2021-09-28 DIAGNOSIS — I110 Hypertensive heart disease with heart failure: Secondary | ICD-10-CM | POA: Insufficient documentation

## 2021-09-28 LAB — BASIC METABOLIC PANEL
Anion gap: 8 (ref 5–15)
BUN: 19 mg/dL (ref 6–20)
CO2: 27 mmol/L (ref 22–32)
Calcium: 9.9 mg/dL (ref 8.9–10.3)
Chloride: 102 mmol/L (ref 98–111)
Creatinine, Ser: 0.84 mg/dL (ref 0.44–1.00)
GFR, Estimated: >60 mL/min (ref >60)
Glucose, Bld: 81 mg/dL (ref 70–99)
Potassium: 4.5 mmol/L (ref 3.5–5.1)
Sodium: 137 mmol/L (ref 135–145)

## 2021-09-28 NOTE — Patient Instructions (Signed)
Thank you for your visit today.  Labs done today, your results will be available in MyChart, we will contact you for abnormal readings.  Your physician has requested that you have an echocardiogram. Echocardiography is a painless test that uses sound waves to create images of your heart. It provides your doctor with information about the size and shape of your heart and how well your hearts chambers and valves are working. This procedure takes approximately one hour. There are no restrictions for this procedure.  Your physician has recommended that you have a sleep study. This test records several body functions during sleep, including: brain activity, eye movement, oxygen and carbon dioxide blood levels, heart rate and rhythm, breathing rate and rhythm, the flow of air through your mouth and nose, snoring, body muscle movements, and chest and belly movement.  Your physician recommends that you schedule a follow-up appointment in: 4 months  If you have any questions or concerns before your next appointment please send [place] a message through [facility] or call our office at [phone].    TO LEAVE A MESSAGE FOR THE NURSE SELECT OPTION 2, PLEASE LEAVE A MESSAGE INCLUDING: YOUR NAME DATE OF BIRTH CALL BACK NUMBER REASON FOR CALL**this is important as we prioritize the call backs  YOU WILL RECEIVE A CALL BACK THE SAME DAY AS LONG AS YOU CALL BEFORE 4:00 PM  At the [facility], you and your health needs are our priority. As part of our continuing mission to provide you with exceptional heart care, we have created designated Provider Care Teams. These Care Teams include your primary Cardiologist (physician) and Advanced Practice Providers (APPs- Physician Assistants and Nurse Practitioners) who all work together to provide you with the care you need, when you need it.   You may see any of the following providers on your designated Care Team at your next follow up: Dr Arvilla Meres Dr Carron Curie, NP Robbie Lis, [place] [facility], [place] Karle Plumber, PharmD   Please be sure to bring in all your medications bottles to every appointment.

## 2021-09-28 NOTE — Progress Notes (Signed)
Advanced Heart Failure Clinic Note   PCP: Linward Foster, MD PCP-Cardiologist: Chilton Si, MD  HF Cardiologist: Dr. Gala Romney  HPI:  Ms. 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 She diuresed well w/ IV Lasix. R/LHC showed normal coronaries and mildly increased left-sided filling pressures with mild mixed pulmonary HTN. cMRI completed & GDMT initiated. Her discharge weight was 163 lbs.  Appointment with Dr. Jomarie Longs for genetic testing 08/22, delayed due to insurance denial.  EF remained severely reduced at 20-25% on echo 09/22.  Underwent ICD placement 08/19/21.  She is here today for close f/u. Accompanied by her father. No CP, palpitations or dyspnea. Her weight has been stable between 160-163 lb. She takes lasix as needed if weight goes above 165 lb. Has not needed lasix in at least a few weeks. Taking all medications as prescribed and tolerating them well.   Cardiac Studies:   - Echo (6/22): EF 20-25%, LV severely depressed, RV ok  - L/RHC (6/22): Normal cors and mildly increased left-sided filling pressures with mild mixed pulmonary HTN. Ao = 117/78 (96) LV = 118/24 RA = 8 RV = 54/12 PA = 49/22 (34) PCW = 14 Fick cardiac output/index = 4.4/2.5 PVR = 4.6 Ao sat = 98% PA sat = 67%, 70%  - cMRI (6/22): EF 23%, LGE in the left ventricular myocardium: Mid myocardial in the basal anteroseptal and inferoseptal. Mild RV systolic dysfunction (RVEF =39%).   IMPRESSION: Severely reduced LVEF, given breath hold artifact during LGE assessment sensitivity for LGE is reduced (in some PSIR views, this appears to be a septal perforator vessel).  - Echo (09/22): EF 20-25%, RV okay, mild MR  - CPX, 09/22:  Resting HR: 59 Standing HR: 60 Peak HR: 121   (71% age predicted max HR)  BP rest: 90/56 Standing BP: 82/60 BP peak: 110/62  Peak VO2: 17.9 (77% predicted peak VO2)   VE/VCO2 slope:  38  OUES: 1.32  Peak RER: 1.17  Ventilatory Threshold: 14.4 (62% predicted or measured peak VO2)  Peak RR 48  Peak Ventilation:  58.8  VE/MVV:  65%  PETCO2 at peak:  31  O2pulse:  11   (110% predicted O2pulse)   Mild to moderate HF limitation with mild chronotropic incompetence. Body habitus contributing to limitation.   ROS: All systems reviewed and negative except as per HPI.   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 60 tablet 11   ENTRESTO 49-51 MG Take 1 tablet by mouth twice daily 60 tablet 11   FARXIGA 10 MG TABS tablet TAKE 1 TABLET BY MOUTH ONCE DAILY BEFORE BREAKFAST 30 tablet 11   furosemide (LASIX) 40 MG tablet Take 1 tablet (40 mg total) by mouth daily as needed. For leg swelling or weight gain. Take for 3 lb wt gain in 24 hr or 5 lb in 1 week 30 tablet 11   Multiple Vitamins-Minerals (MULTIVITAMIN WITH MINERALS) tablet Take 1 tablet by mouth daily.     potassium chloride SA (KLOR-CON) 20 MEQ tablet  Take 1 tablet (20 mEq total) by mouth daily as needed. Take only when you need to take a lasix tablet 30 tablet 5   sodium chloride (OCEAN) 0.65 % SOLN nasal spray Place 1 spray into both nostrils daily.     spironolactone (ALDACTONE) 25 MG tablet Take 1 tablet (25 mg total) by mouth daily. 30 tablet 11   No current facility-administered medications for this encounter.   Allergies  Allergen Reactions   Tape Dermatitis    Pt states she is allergic to the clear tape that was applied to her wrist after her heart cath    Social History   Socioeconomic History   Marital status: Single    Spouse name: Not on file    Number of children: Not on file   Years of education: Not on file   Highest education level: Not on file  Occupational History   Not on file  Tobacco Use   Smoking status: Never   Smokeless tobacco: Never  Substance and Sexual Activity   Alcohol use: Yes    Comment: 1-2 per week   Drug use: No   Sexual activity: Yes  Other Topics Concern   Not on file  Social History Narrative   Not on file   Social Determinants of Health   Financial Resource Strain: Not on file  Food Insecurity: Not on file  Transportation Needs: Not on file  Physical Activity: Not on file  Stress: Not on file  Social Connections: Not on file  Intimate Partner Violence: Not on file   Family History  Problem Relation Age of Onset   Hypertension Mother    CAD Mother    Prostate cancer Father    Heart failure Maternal Aunt    Heart failure Maternal Aunt    Heart failure Maternal Aunt    Heart failure Maternal Uncle    Anemia Neg Hx    Arrhythmia Neg Hx    Asthma Neg Hx    Clotting disorder Neg Hx    Fainting Neg Hx    Hyperlipidemia Neg Hx    BP 130/80    Pulse 65    Wt 75.2 kg (165 lb 12.8 oz)    SpO2 98%    BMI 31.33 kg/m   Wt Readings from Last 3 Encounters:  09/28/21 75.2 kg (165 lb 12.8 oz)  08/19/21 73.9 kg (163 lb)  05/20/21 72.5 kg (159 lb 12.8 oz)   PHYSICAL EXAM: General:  No distress. Ambulated into clinic. HEENT: normal Neck: supple. no JVD. Carotids 2+ bilat; no bruits. Cor: PMI nondisplaced. Regular rate & rhythm. No rubs, gallops or murmurs. Lungs: clear Abdomen: soft, nontender, nondistended. No hepatosplenomegaly.  Extremities: no cyanosis, clubbing, rash, edema Neuro: alert & orientedx3, cranial nerves grossly intact. moves all 4 extremities w/o difficulty. Affect pleasant   ASSESSMENT & PLAN: Chronic systolic Heart Failure (New): - Echo (6/22): EF 20-25%, GIIDD, RV normal. + RWMAs (AK LV, entire inferoseptal wall, apical segment and inferior wall) - R/LHC (6/22):  normal cors and mildly increased left-sided filling pressures with mild mixed pulmonary HTN. - cMRI (6/22): Severely reduced systolic dysfunction LVEF 23%, mild RV systolic dysfunction RVEF 39%, breath hold artifact dueing LGE assessment - Echo (09/22): EF 20-25% - CPX 09/22: Mild to moderate HF limitation with mild chronotropic incompetence, body habitus also contributing to limitation - Genetic testing incomplete d/t insurance denial. 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 - s/p ICD 08/19/21 -  NYHA II. Volume looks good. Has lasix + KCl to use prn. - Continue carvedilol 3.125 mg bid.  - Continue Farxiga 10 mg daily. - Continue Entresto 97/103 mg BID - Continue spiro 25 mg daily. - Refer to cardiac rehab at [facility]. - BMET today. - Echo 3-4 months to reassess LV function/EF   2. HTN: - BP at goal. - Continue current medications.  3. Snoring - Insurance won't cover outpatient sleep study; inpatient ordered but has not been completed. She states she was not contacted to set up study.  - Order resubmitted today  4. Obesity: - Body mass index is 31.33 kg/m. - Encouraged dietary changes and gentle exercise to aid in weight reduction.  5. Pre-Diabetes: - A1c 6.3 06/22.  - On Farxiga.  Follow-up: 3-4 months with APP, same day echo  [facility], LINDSAY N, PA-C 09/28/21   Patient seen and examined with the above-signed Advanced Practice Provider and/or Housestaff. I personally reviewed laboratory data, imaging studies and relevant notes. I independently examined the patient and formulated the important aspects of the plan. I have edited the note to reflect any of my changes or salient points. I have personally discussed the plan with the patient and/or family.  Doing well. NYHA II. Volume status ok. Tolerating GDMT. Had recent ICD implant.    General:  Well appearing. No resp difficulty HEENT: normal Neck: supple. no JVD. Carotids 2+  bilat; no bruits. No lymphadenopathy or thryomegaly appreciated. Cor: PMI nondisplaced. Regular rate & rhythm. No rubs, gallops or murmurs. Lungs: clear Abdomen: soft, nontender, nondistended. No hepatosplenomegaly. No bruits or masses. Good bowel sounds. Extremities: no cyanosis, clubbing, rash, edema Neuro: alert & orientedx3, cranial nerves grossly intact. moves all 4 extremities w/o difficulty. Affect pleasant  Stable NYHA II. Volume ok. On good GDMT. Labs today. RTC in 4 months with echo.   Arvilla Meres, MD  4:38 PM

## 2021-09-30 ENCOUNTER — Encounter (HOSPITAL_COMMUNITY): Payer: Self-pay | Admitting: Internal Medicine

## 2021-10-07 ENCOUNTER — Encounter (HOSPITAL_COMMUNITY): Payer: Self-pay | Admitting: *Deleted

## 2021-10-07 NOTE — Progress Notes (Signed)
Disability forms completed, signed by Dr Gala Romney, and faxed to [facility] at [phone]. Pt is aware this has been done and copy to mailed to her

## 2021-10-09 ENCOUNTER — Encounter (HOSPITAL_COMMUNITY): Admission: RE | Admit: 2021-10-09 | Payer: BC Managed Care – PPO | Source: Ambulatory Visit

## 2021-10-19 ENCOUNTER — Encounter (HOSPITAL_COMMUNITY): Payer: Self-pay | Admitting: Internal Medicine

## 2021-10-26 ENCOUNTER — Encounter: Payer: BC Managed Care – PPO | Admitting: Internal Medicine

## 2021-10-28 ENCOUNTER — Other Ambulatory Visit: Payer: Self-pay

## 2021-10-28 ENCOUNTER — Ambulatory Visit (HOSPITAL_BASED_OUTPATIENT_CLINIC_OR_DEPARTMENT_OTHER): Payer: 59 | Attending: Internal Medicine | Admitting: Cardiology

## 2021-10-28 DIAGNOSIS — I493 Ventricular premature depolarization: Secondary | ICD-10-CM | POA: Diagnosis not present

## 2021-10-28 DIAGNOSIS — I5042 Chronic combined systolic (congestive) and diastolic (congestive) heart failure: Secondary | ICD-10-CM | POA: Diagnosis not present

## 2021-11-04 ENCOUNTER — Telehealth (HOSPITAL_COMMUNITY): Payer: Self-pay | Admitting: Pharmacy Technician

## 2021-11-04 ENCOUNTER — Other Ambulatory Visit (HOSPITAL_COMMUNITY): Payer: Self-pay

## 2021-11-04 NOTE — Procedures (Signed)
? ?  Patient Name: Wendy Stephens, Wendy Stephens ?Study Date:10/28/2021 ?Gender: Female ?D.O.B: Jan 20, 1971 ?Age (years): 50 ?Referring Provider: Dolores Patty, MD ?Height (inches): 61 ?Interpreting Physician: Armanda Magic MD, ABSM ?Weight (lbs): 163 ?RPSGT: Lowry Ram ?BMI: 31 ?MRN: [id number] ?Neck Size: 15.00 ? ?CLINICAL INFORMATION ?Sleep Study Type: NPSG ? ?Indication for sleep study: Congestive Heart Failure, Hypertension ? ?Epworth Sleepiness Score: 9 ? ?SLEEP STUDY TECHNIQUE ?As per the AASM Manual for the Scoring of Sleep and Associated Events v2.3 (April 2016) with a hypopnea requiring 4% desaturations. ? ?The channels recorded and monitored were frontal, central and occipital EEG, electrooculogram (EOG), submentalis EMG (chin), nasal and oral airflow, thoracic and abdominal wall motion, anterior tibialis EMG, snore microphone, electrocardiogram, and pulse oximetry. ? ?MEDICATIONS ?Medications self-administered by patient taken the night of the study : N/A ? ?SLEEP ARCHITECTURE ?The study was initiated at 11:09:59 PM and ended at 5:18:14 AM. ? ?Sleep onset time was 5.1 minutes and the sleep efficiency was 81.2%. The total sleep time was 299 minutes. ? ?Stage REM latency was 65.5 minutes. ? ?The patient spent 5.9% of the night in stage N1 sleep, 79.1% in stage N2 sleep, 0.0% in stage N3 and 15.1% in REM. ? ?Alpha intrusion was absent. ? ?Supine sleep was 97.99%. ? ?RESPIRATORY PARAMETERS ?The overall apnea/hypopnea index (AHI) was 0.2 per hour. There were 0 total apneas, including 0 obstructive, 0 central and 0 mixed apneas. There were 1 hypopneas and 0 RERAs. ? ?The AHI during Stage REM sleep was 0.0 per hour. ? ?AHI while supine was 0.2 per hour. ? ?The mean oxygen saturation was 93.0%. The minimum SpO2 during sleep was 90.0%. ? ?snoring was noted during this study. ? ?CARDIAC DATA ?The 2 lead EKG demonstrated pacemaker generated. The mean heart rate was 67.6 beats per minute. Other EKG findings include:  PVCs. ? ?LEG MOVEMENT DATA ?The total PLMS were 0 with a resulting PLMS index of 0.0. Associated arousal with leg movement index was 0.0 . ? ?IMPRESSIONS ?- No significant obstructive sleep apnea occurred during this study (AHI = 0.2/h). ?- The patient had minimal or no oxygen desaturation during the study (Min O2 = 90.0%) ?- No snoring was audible during this study. ?- EKG findings include PVCs. ?- Clinically significant periodic limb movements did not occur during sleep. No significant associated arousals. ? ?DIAGNOSIS ?- Normal Study ? ?RECOMMENDATIONS ?- Avoid alcohol, sedatives and other CNS depressants that may worsen sleep apnea and disrupt normal sleep architecture. ?- Sleep hygiene should be reviewed to assess factors that may improve sleep quality. ?- Weight management and regular exercise should be initiated or continued if appropriate. ? ?[Electronically signed] 11/04/2021 02:09 PM ? ?Armanda Magic MD, ABSM ?Diplomate, Biomedical engineer of Sleep Medicine ?

## 2021-11-04 NOTE — Telephone Encounter (Signed)
Advanced Heart Failure Patient Advocate Encounter ? ?Patient left message stating that the farxiga copay was high. Called and left her a message to return my call. Not sure if she has a copay card or not. Would need to find this out before moving on to next steps. ? ?Archer Asa, CPhT ? ?

## 2021-11-05 ENCOUNTER — Other Ambulatory Visit (HOSPITAL_COMMUNITY): Payer: Self-pay

## 2021-11-05 ENCOUNTER — Encounter (HOSPITAL_COMMUNITY): Payer: BC Managed Care – PPO

## 2021-11-05 NOTE — Telephone Encounter (Signed)
Advanced Heart Failure Patient Advocate Encounter ? ?Spoke with patient regarding copay of Farxiga. Called the pharmacy, confirmed they are using the copay card. The copay leftover is from an unmet deductible. Advised the patient to wait until she can refill Entresto on 03/31 before trying to refill [place]. That claim should fulfill the unmet deductible bringing the Farxiga copay back down.  ? ?The patient informed me that she will be going back to [facility] April 1. Advised her to wait until then to fill Entresto. No need in meeting the deductible for both plans if she will only have Anthem after April 1. Patient was agreeable to plan. Depending on the amount of the deductible, we may need to provide samples for Farxiga in the future. Waylan Boga (RN) a request for samples. Sent Wendy Stephens (CMA) a 90 day rx request to send to [facility]. ? ?Advised the patient to call back with any other issues or questions. ? ?Wendy Stephens, CPhT ? ?

## 2021-11-11 ENCOUNTER — Encounter (HOSPITAL_COMMUNITY): Payer: Self-pay | Admitting: *Deleted

## 2021-11-14 ENCOUNTER — Telehealth: Payer: Self-pay | Admitting: *Deleted

## 2021-11-14 NOTE — Telephone Encounter (Addendum)
The patient has been notified of the result and verbalized understanding.  All questions (if any) were answered. ?Wendy Stephens, CMA 11/14/2021 12:34 AM   ? ?Pt is aware and agreeable to normal results.  ?

## 2021-11-14 NOTE — Telephone Encounter (Signed)
-----   Message from Gaynelle Cage, CMA sent at 11/05/2021  8:01 AM EDT ----- ? ?----- Message ----- ?From: Quintella Reichert, MD ?Sent: 11/04/2021   2:10 PM EDT ?To: Cv Div Sleep Studies ? ?Please let patient know that sleep study showed no significant sleep apnea.   ?  ? ?

## 2021-11-17 ENCOUNTER — Ambulatory Visit (INDEPENDENT_AMBULATORY_CARE_PROVIDER_SITE_OTHER): Payer: 59

## 2021-11-17 DIAGNOSIS — I429 Cardiomyopathy, unspecified: Secondary | ICD-10-CM | POA: Diagnosis not present

## 2021-11-17 DIAGNOSIS — I5022 Chronic systolic (congestive) heart failure: Secondary | ICD-10-CM

## 2021-11-17 LAB — CUP PACEART REMOTE DEVICE CHECK
Battery Remaining Longevity: 132 mo
Battery Remaining Percentage: 100 %
Brady Statistic RA Percent Paced: 0 %
Brady Statistic RV Percent Paced: 0 %
Date Time Interrogation Session: 20230328020100
HighPow Impedance: 57 Ohm
Implantable Lead Implant Date: 20221228
Implantable Lead Location: 753860
Implantable Lead Model: 137
Implantable Lead Serial Number: 301300
Implantable Pulse Generator Implant Date: 20221228
Lead Channel Impedance Value: 507 Ohm
Lead Channel Pacing Threshold Amplitude: 0.7 V
Lead Channel Pacing Threshold Pulse Width: 0.4 ms
Lead Channel Setting Pacing Amplitude: 2 V
Lead Channel Setting Pacing Pulse Width: 0.4 ms
Lead Channel Setting Sensing Sensitivity: 0.5 mV
Lead Channel Setting Sensing Sensitivity: 1 mV
Pulse Gen Serial Number: 390417

## 2021-11-23 ENCOUNTER — Encounter: Payer: Self-pay | Admitting: Internal Medicine

## 2021-11-23 ENCOUNTER — Ambulatory Visit (INDEPENDENT_AMBULATORY_CARE_PROVIDER_SITE_OTHER): Payer: 59 | Admitting: Internal Medicine

## 2021-11-23 VITALS — BP 112/63 | HR 66 | Ht 61.0 in | Wt 165.0 lb

## 2021-11-23 DIAGNOSIS — I428 Other cardiomyopathies: Secondary | ICD-10-CM | POA: Diagnosis not present

## 2021-11-23 DIAGNOSIS — Z95810 Presence of automatic (implantable) cardiac defibrillator: Secondary | ICD-10-CM

## 2021-11-23 DIAGNOSIS — I5022 Chronic systolic (congestive) heart failure: Secondary | ICD-10-CM | POA: Diagnosis not present

## 2021-11-23 NOTE — Progress Notes (Signed)
? ? ? ? ?Patient Care Team: ?Linward Foster, MD as PCP - General (Internal Medicine) ?Chilton Si, MD as PCP - Cardiology (Cardiology) ?Bensimhon, Bevelyn Buckles, MD as PCP - Advanced Heart Failure (Cardiology) ? ? ?HPI ? ?Wendy Stephens is a 51 y.o. 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 ?The patient denies chest pain, nocturnal dyspnea, orthopnea or peripheral edema.  There have been no palpitations, lightheadedness or syncope.  Complains of mild stable dyspnea. ? ?Also discomfort with her left arm and shoulder mobility.  ? ?  ?DATE TEST EF    ?6/22 Echo  20-25 %    ?6/22 LHC 20-25 Normal cors  ?6/22 cMRI 23% LGE basal septum  ?9/22 Echo   20-25%   ?  ?Date Cr K Hgb  ?6/22 1.07   13.6  ?9/22 0.82 4.2    ?2/23 0.84 4.5 13.0(12/22)  ?  ?  ?Records and Results Reviewed  ? ?Past Medical History:  ?Diagnosis Date  ? Back pain   ? Chronic systolic heart failure (HCC)   ? Essential hypertension 03/05/2021  ? Hypertension   ? Nonischemic cardiomyopathy (HCC)   ? probably genetic  ? Palpitations   ? ? ?Past Surgical History:  ?Procedure Laterality Date  ? ICD IMPLANT N/A 08/19/2021  ? Procedure: ICD IMPLANT;  Surgeon: Duke Salvia, MD;  Location: [facility] INVASIVE CV LAB;  Service: Cardiovascular;  Laterality: N/A;  ? MYOMECTOMY  2010  ? RIGHT/LEFT HEART CATH AND CORONARY ANGIOGRAPHY N/A 02/06/2021  ? Procedure: RIGHT/LEFT HEART CATH AND CORONARY ANGIOGRAPHY;  Surgeon: Dolores Patty, MD;  Location: [facility] INVASIVE CV LAB;  Service: Cardiovascular;  Laterality: N/A;  ? ? ?Current Meds  ?Medication Sig  ? carvedilol (COREG) 3.125 MG tablet Take 1 tablet (3.125 mg total) by mouth 2 (two) times daily.  ? ENTRESTO 49-51 MG Take 1 tablet by mouth twice daily  ? FARXIGA 10 MG TABS tablet TAKE 1 TABLET BY MOUTH ONCE DAILY BEFORE BREAKFAST  ? furosemide (LASIX) 40 MG tablet Take 1 tablet (40 mg total) by mouth  daily as needed. For leg swelling or weight gain. Take for 3 lb wt gain in 24 hr or 5 lb in 1 week  ? Multiple Vitamins-Minerals (MULTIVITAMIN WITH MINERALS) tablet Take 1 tablet by mouth daily.  ? potassium chloride SA (KLOR-CON) 20 MEQ tablet Take 1 tablet (20 mEq total) by mouth daily as needed. Take only when you need to take a lasix tablet  ? sodium chloride (OCEAN) 0.65 % SOLN nasal spray Place 1 spray into both nostrils daily.  ? spironolactone (ALDACTONE) 25 MG tablet Take 1 tablet (25 mg total) by mouth daily.  ? ? ?Allergies  ?Allergen Reactions  ? Tape Dermatitis  ?  Pt states she is allergic to the clear tape that was applied to her wrist after her heart cath  ? ? ? ? ?Review of Systems negative except from HPI and PMH ? ?Physical Exam ?BP 112/63   Pulse 66   Ht 5\' 1"  (1.549 m)   Wt 165 lb (74.8 kg)   SpO2 98%   BMI 31.18 kg/m?  ?Well developed and well nourished in no acute distress ?HENT normal ?E scleral and icterus clear ?Neck Supple ?JVP flat; carotids brisk and full ?Clear to ausculation ?Device pocket well healed; without hematoma or erythema.  There is no tethering Regular rate and rhythm, no murmurs gallops  or rub ?Soft with active bowel sounds ?No clubbing cyanosis  Edema ?Alert and oriented, grossly normal motor and sensory function significant limitations on her left arm going over her head as well as limitations behind her back ?Skin Warm and Dry ? ?ECG sinus at 66 ?22/08/43 ?Nonspecific T wave flattening ? ?CrCl cannot be calculated (Patient's most recent lab result is older than the maximum 21 days allowed.). ? ? ?Assessment and  Plan ? 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  ?Congestive heart failure chronic systolic class II ? ?First-degree AV block ?  ?Frozen shoulder ? ?ICD-[employer] ?  ?Euvolemic.  Continue the Farxiga 10 and the furosemide 40 on a as needed basis. ? ?With cardiomyopathy, continue carvedilol  3.125 Entresto 49/51 spironolactone 25. ? ?She has a frozen shoulder post implant.  I have reviewed with her shoulder exercises and have asked her to call us within the month to let us know whether she is making progress, in the event that not, I will refer her to physical therapy ? 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 ? ? ? ?Current medicines are reviewed at length with the patient today .  The patient does not  have concerns regarding medicines. ? ?

## 2021-11-23 NOTE — Patient Instructions (Addendum)
Medication Instructions:  ?Your physician recommends that you continue on your current medications as directed. Please refer to the Current Medication list given to you today. ? ?Labwork: ?None ordered. ? ?Testing/Procedures: ?None ordered. ? ?Follow-Up: ?Your physician wants you to follow-up in: 9 months with Dr. Graciela Husbands or one of the following Advanced Practice Providers on your designated Care Team:   ?Francis Dowse, PA-C ?Casimiro Needle "Mardelle Matte" [place], PA-C ? ?Remote monitoring is used to monitor your ICD from home. This monitoring reduces the number of office visits required to check your device to one time per year. It allows [place] to keep an eye on the functioning of your device to ensure it is working properly. You are scheduled for a device check from home on 02/16/2022. You may send your transmission at any time that day. If you have a wireless device, the transmission will be sent automatically. After your physician reviews your transmission, you will receive a postcard with your next transmission date. ? ?Any Other Special Instructions Will Be Listed Below (If Applicable). ? ?If you need a refill on your cardiac medications before your next appointment, please call your pharmacy.  ? ? ? ? ?

## 2021-11-27 NOTE — Progress Notes (Signed)
Remote ICD transmission.   

## 2021-12-03 ENCOUNTER — Encounter (HOSPITAL_COMMUNITY): Payer: Self-pay

## 2021-12-03 NOTE — Progress Notes (Unsigned)
Medication Samples have been provided to the patient. ? ?Drug name: Marcelline Deist       Strength: 10 mg         Qty: 4  LOT: [id number]  Exp.Date: 06/22/2024 ? ?Dosing instructions: Take 1 tablet daily ? ?The patient has been instructed regarding the correct time, dose, and frequency of taking this medication, including desired effects and most common side effects.  ? ?Wendy Stephens Wendy Stephens ?9:50 AM ?12/03/2021 ? ?

## 2021-12-31 ENCOUNTER — Encounter (HOSPITAL_COMMUNITY): Payer: Self-pay

## 2022-01-04 ENCOUNTER — Other Ambulatory Visit (HOSPITAL_COMMUNITY): Payer: Self-pay

## 2022-01-04 ENCOUNTER — Telehealth (HOSPITAL_COMMUNITY): Payer: Self-pay | Admitting: Pharmacy Technician

## 2022-01-04 NOTE — Telephone Encounter (Signed)
Patient Advocate Encounter ?  ?Received notification from [employer] that prior authorization for Wendy Stephens is required. ?  ?PA submitted on CoverMyMeds ?Key [id number] ?Status is pending ?  ?Will continue to follow. ? ?

## 2022-01-06 ENCOUNTER — Other Ambulatory Visit (HOSPITAL_COMMUNITY): Payer: Self-pay

## 2022-01-06 NOTE — Telephone Encounter (Signed)
Advanced Heart Failure Patient Advocate Encounter ? ?Prior Authorization for Delene Loll has been approved.   ? ?PA# [id number] ?Effective dates: 01/04/22 through 01/04/23 ? ?Patients co-pay is $159.32 (30 days), $467.41 (90 days) ? ?Called and spoke with the patient. Insurance would like the patient to move to mail order with 90 day fills for reduced co-pays. A lot of mail order pharmacies won't accept co-pay cards for non-specialty medications. Advised the patient to find out which mail order pharmacy the insurance wants her to use, then ask if the co-pay cards can be used. If not, they will typically allow the patient to continue filling at the local pharmacy level. ? ?Advised the patient to call back with any other issues or questions.  ? ?Charlann Boxer, CPhT ?

## 2022-01-27 ENCOUNTER — Other Ambulatory Visit (HOSPITAL_COMMUNITY): Payer: Self-pay

## 2022-01-27 ENCOUNTER — Encounter (HOSPITAL_COMMUNITY): Payer: Self-pay | Admitting: Internal Medicine

## 2022-02-16 ENCOUNTER — Ambulatory Visit (INDEPENDENT_AMBULATORY_CARE_PROVIDER_SITE_OTHER): Payer: BC Managed Care – PPO

## 2022-02-16 DIAGNOSIS — I428 Other cardiomyopathies: Secondary | ICD-10-CM

## 2022-02-17 LAB — CUP PACEART REMOTE DEVICE CHECK
Battery Remaining Longevity: 132 mo
Battery Remaining Percentage: 100 %
Brady Statistic RA Percent Paced: 0 %
Brady Statistic RV Percent Paced: 0 %
Date Time Interrogation Session: 20230627020100
HighPow Impedance: 55 Ohm
Implantable Lead Implant Date: 20221228
Implantable Lead Location: 753860
Implantable Lead Model: 137
Implantable Lead Serial Number: 301300
Implantable Pulse Generator Implant Date: 20221228
Lead Channel Impedance Value: 483 Ohm
Lead Channel Pacing Threshold Amplitude: 0.7 V
Lead Channel Pacing Threshold Pulse Width: 0.4 ms
Lead Channel Setting Pacing Amplitude: 2 V
Lead Channel Setting Pacing Pulse Width: 0.4 ms
Lead Channel Setting Sensing Sensitivity: 0.5 mV
Lead Channel Setting Sensing Sensitivity: 1 mV
Pulse Gen Serial Number: 390417

## 2022-02-18 ENCOUNTER — Ambulatory Visit (HOSPITAL_BASED_OUTPATIENT_CLINIC_OR_DEPARTMENT_OTHER)
Admission: RE | Admit: 2022-02-18 | Discharge: 2022-02-18 | Disposition: A | Discharge status: Home / Self Care | Payer: BC Managed Care – PPO | Source: Ambulatory Visit | Attending: Internal Medicine | Admitting: Internal Medicine

## 2022-02-18 ENCOUNTER — Ambulatory Visit (HOSPITAL_COMMUNITY)
Admission: RE | Admit: 2022-02-18 | Discharge: 2022-02-18 | Disposition: A | Discharge status: Home / Self Care | Payer: BC Managed Care – PPO | Source: Ambulatory Visit | Attending: Internal Medicine | Admitting: Internal Medicine

## 2022-02-18 ENCOUNTER — Encounter (HOSPITAL_COMMUNITY): Payer: Self-pay | Admitting: Internal Medicine

## 2022-02-18 VITALS — BP 124/80 | HR 59 | Wt 166.2 lb

## 2022-02-18 DIAGNOSIS — R7303 Prediabetes: Secondary | ICD-10-CM | POA: Diagnosis not present

## 2022-02-18 DIAGNOSIS — Z6831 Body mass index (BMI) 31.0-31.9, adult: Secondary | ICD-10-CM | POA: Insufficient documentation

## 2022-02-18 DIAGNOSIS — Z79899 Other long term (current) drug therapy: Secondary | ICD-10-CM | POA: Insufficient documentation

## 2022-02-18 DIAGNOSIS — Z95810 Presence of automatic (implantable) cardiac defibrillator: Secondary | ICD-10-CM | POA: Insufficient documentation

## 2022-02-18 DIAGNOSIS — I5042 Chronic combined systolic (congestive) and diastolic (congestive) heart failure: Secondary | ICD-10-CM | POA: Diagnosis not present

## 2022-02-18 DIAGNOSIS — I5022 Chronic systolic (congestive) heart failure: Secondary | ICD-10-CM | POA: Insufficient documentation

## 2022-02-18 DIAGNOSIS — E669 Obesity, unspecified: Secondary | ICD-10-CM | POA: Insufficient documentation

## 2022-02-18 DIAGNOSIS — I272 Pulmonary hypertension, unspecified: Secondary | ICD-10-CM | POA: Diagnosis not present

## 2022-02-18 DIAGNOSIS — I110 Hypertensive heart disease with heart failure: Secondary | ICD-10-CM | POA: Insufficient documentation

## 2022-02-18 DIAGNOSIS — I10 Essential (primary) hypertension: Secondary | ICD-10-CM | POA: Diagnosis not present

## 2022-02-18 LAB — ECHOCARDIOGRAM COMPLETE
AR max vel: 1.99 cm2
AV Area VTI: 1.81 cm2
AV Area mean vel: 2.01 cm2
AV Mean grad: 3 mmHg
AV Peak grad: 6.6 mmHg
Ao pk vel: 1.28 m/s
Area-P 1/2: 3.03 cm2
Calc EF: 41.3 %
S' Lateral: 4.05 cm
Single Plane A2C EF: 44.9 %
Single Plane A4C EF: 39 %

## 2022-02-18 LAB — BASIC METABOLIC PANEL
Anion gap: 11 (ref 5–15)
BUN: 15 mg/dL (ref 6–20)
CO2: 27 mmol/L (ref 22–32)
Calcium: 9.7 mg/dL (ref 8.9–10.3)
Chloride: 100 mmol/L (ref 98–111)
Creatinine, Ser: 0.83 mg/dL (ref 0.44–1.00)
GFR, Estimated: >60 mL/min (ref >60)
Glucose, Bld: 92 mg/dL (ref 70–99)
Potassium: 4.2 mmol/L (ref 3.5–5.1)
Sodium: 138 mmol/L (ref 135–145)

## 2022-02-18 LAB — BRAIN NATRIURETIC PEPTIDE: B Natriuretic Peptide: 22.6 pg/mL (ref 0.0–100.0)

## 2022-02-18 MED ORDER — DAPAGLIFLOZIN PROPANEDIOL 10 MG PO TABS: 10.0000 mg | ORAL_TABLET | Freq: Every day | ORAL | 3 refills | Status: DC

## 2022-02-18 MED ORDER — ENTRESTO 97-103 MG PO TABS
1.0000 | ORAL_TABLET | Freq: Two times a day (BID) | ORAL | 3 refills | Status: DC
Start: 2022-02-18 — End: 2023-02-14

## 2022-02-18 MED ORDER — SPIRONOLACTONE 25 MG PO TABS: 25.0000 mg | ORAL_TABLET | Freq: Every day | ORAL | 3 refills | Status: DC

## 2022-02-18 MED ORDER — CARVEDILOL 3.125 MG PO TABS: 3.1250 mg | ORAL_TABLET | Freq: Two times a day (BID) | ORAL | 3 refills | Status: DC

## 2022-02-18 NOTE — Progress Notes (Signed)
Advanced Heart Failure Clinic Note   PCP: Linward Foster, MD PCP-Cardiologist: Chilton Si, MD  HF Cardiologist: Dr. Gala Romney  HPI:  Ms. Stapleton in a 51 yo AAF woman with HTN and systolic HF due to probable familial cardiomyopathy   She presented to the [facility] on 02/04[facility]21 w/ acute HF. She diuresed well w/ IV Lasix. R/LHC showed normal coronaries and mildly increased left-sided filling pressures with mild mixed pulmonary HTN. cMRI completed & GDMT initiated. Her discharge weight was 163 lbs.  Appointment with Dr. Jomarie Longs for genetic testing 08/22, delayed due to insurance denial.  EF remained severely reduced at 20-25% on echo 09/22.  Underwent ICD placement 08/19/21.  She is here today for close f/u. Accompanied by her father. Reports doing well. Enrolled in cardiac rehab. Functional status improved, NYHA Class I-II. Euvolemic on exam and device interrogation. Heart Logic score 4. No AF/AT. No VT/VF. Activity level ~1.8 hr/ day. BP well controlled. Reports full compliance w/ meds. Tolerating well w/o side effects. Takes lasix PRN. Does not need to use often.   Echo done today 02/18/22, EF 30-35%, RV mildly reduced    Cardiac Studies:   - Echo (6/22): EF 20-25%, LV severely depressed, RV ok  - L/RHC (6/22): Normal cors and mildly increased left-sided filling pressures with mild mixed pulmonary HTN. Ao = 117/78 (96) LV = 118/24 RA = 8 RV = 54/12 PA = 49/22 (34) PCW = 14 Fick cardiac output/index = 4.4/2.5 PVR = 4.6 Ao sat = 98% PA sat = 67%, 70%  - cMRI (6/22): EF 23%, LGE in the left ventricular myocardium: Mid myocardial in the basal anteroseptal and inferoseptal. Mild RV systolic dysfunction (RVEF =39%).   IMPRESSION: Severely reduced LVEF, given breath hold artifact during LGE assessment sensitivity for LGE is reduced (in some PSIR views, this appears to be a septal perforator vessel).  - Echo (09/22): EF 20-25%, RV okay, mild MR  - CPX, 09/22:  Resting HR: 59  Standing HR: 60 Peak HR: 121   (71% age predicted max HR)  BP rest: 90/56 Standing BP: 82/60 BP peak: 110/62  Peak VO2: 17.9 (77% predicted peak VO2)  VE/VCO2 slope:  38  OUES: 1.32  Peak RER: 1.17  Ventilatory Threshold: 14.4 (62% predicted or measured peak VO2)  Peak RR 48  Peak Ventilation:  58.8  VE/MVV:  65%  PETCO2 at peak:  31  O2pulse:  11   (110% predicted O2pulse)   Mild to moderate HF limitation with mild chronotropic incompetence. Body habitus contributing to limitation.   ROS: All systems reviewed and negative except as per HPI.   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 60 tablet 11   ENTRESTO 49-51 MG Take 1 tablet by mouth twice daily 60 tablet 11   FARXIGA 10 MG TABS tablet TAKE 1 TABLET BY MOUTH ONCE DAILY BEFORE BREAKFAST 30 tablet 11   furosemide (LASIX) 40 MG tablet Take 1 tablet (40 mg total) by mouth daily as needed. For leg swelling or weight gain. Take for 3 lb wt gain in 24 hr or 5 lb in 1 week 30 tablet 11  Multiple Vitamins-Minerals (MULTIVITAMIN WITH MINERALS) tablet Take 1 tablet by mouth daily.     potassium chloride SA (KLOR-CON) 20 MEQ tablet Take 1 tablet (20 mEq total) by mouth daily as needed. Take only when you need to take a lasix tablet 30 tablet 5   sodium chloride (OCEAN) 0.65 % SOLN nasal spray Place 1 spray into both nostrils daily.     spironolactone (ALDACTONE) 25 MG tablet Take 1 tablet (25 mg total) by mouth daily. 30 tablet 11   No current facility-administered medications for this encounter.   Allergies  Allergen Reactions   Tape Dermatitis    Pt states she is allergic to the clear tape that was  applied to her wrist after her heart cath    Social History   Socioeconomic History   Marital status: Single    Spouse name: Not on file   Number of children: Not on file   Years of education: Not on file   Highest education level: Not on file  Occupational History   Not on file  Tobacco Use   Smoking status: Never   Smokeless tobacco: Never  Substance and Sexual Activity   Alcohol use: Yes    Comment: 1-2 per week   Drug use: No   Sexual activity: Yes  Other Topics Concern   Not on file  Social History Narrative   Not on file   Social Determinants of Health   Financial Resource Strain: Not on file  Food Insecurity: Not on file  Transportation Needs: Not on file  Physical Activity: Not on file  Stress: Not on file  Social Connections: Not on file  Intimate Partner Violence: Not on file   Family History  Problem Relation Age of Onset   Hypertension Mother    CAD Mother    Prostate cancer Father    Heart failure Maternal Aunt    Heart failure Maternal Aunt    Heart failure Maternal Aunt    Heart failure Maternal Uncle    Anemia Neg Hx    Arrhythmia Neg Hx    Asthma Neg Hx    Clotting disorder Neg Hx    Fainting Neg Hx    Hyperlipidemia Neg Hx    BP 124/80   Pulse (!) 59   Wt 75.4 kg (166 lb 3.2 oz)   SpO2 98%   BMI 31.40 kg/m   Wt Readings from Last 3 Encounters:  02/18/22 75.4 kg (166 lb 3.2 oz)  11/23/21 74.8 kg (165 lb)  10/28/21 73.9 kg (163 lb)   PHYSICAL EXAM: General:  Well appearing. No respiratory difficulty HEENT: normal Neck: supple. no JVD. Carotids 2+ bilat; no bruits. No lymphadenopathy or thyromegaly appreciated. Cor: PMI nondisplaced. Regular rate & rhythm. No rubs, gallops or murmurs. Lungs: clear Abdomen: soft, nontender, nondistended. No hepatosplenomegaly. No bruits or masses. Good bowel sounds. Extremities: no cyanosis, clubbing, rash, edema Neuro: alert & oriented x 3, cranial nerves grossly intact. moves all 4 extremities  w/o difficulty. Affect pleasant.    ASSESSMENT & PLAN: Chronic systolic Heart Failure (New): - Echo (6/22): EF 20-25%, GIIDD, RV normal. + RWMAs (AK LV, entire inferoseptal wall, apical segment and inferior wall) - R/LHC (6/22): normal cors and mildly increased left-sided filling pressures with mild mixed pulmonary HTN. - cMRI (6/22): Severely reduced systolic dysfunction LVEF 23%, mild RV systolic dysfunction RVEF 39%, breath hold artifact dueing LGE assessment - Echo (09/22): EF 20-25% - CPX 09/22: Mild to moderate HF limitation with mild chronotropic incompetence, body  habitus also contributing to limitation - Genetic testing incomplete d/t insurance denial. 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 - Echo repeated today 02/18/22, EF 35-40%,  (read as 30-35%) RV mildly reduced  - s/p ICD 08/19/21. No VT/VF on device interrogation  - NYHA I-II. Euvolemic on exam and device interrogation. HL score 4  - Continue Lasix 40 mg PRN  - Continue carvedilol 3.125 mg bid.  - Continue Farxiga 10 mg daily. - Increase Entresto to 97/103 mg BID - Continue spiro 25 mg daily. - Continue cardiac rehab  - BMET today.   2. HTN: -Controlled on current regimen  - GDMT titration per above   3. Snoring - Had sleep study 3/23. Normal study   4. Obesity: - Body mass index is 31.4 kg/m. - Encouraged dietary changes and gentle exercise to aid in weight reduction.  5. Pre-Diabetes: - A1c 6.3 06/22.  - On Farxiga.  F/u in 3-4 months   Robbie Lis, PA-C 02/18/22   Patient seen and examined with the above-signed Advanced Practice Provider and/or Housestaff. I personally reviewed laboratory data, imaging studies and relevant notes. I independently examined the patient and formulated the important aspects of the plan. I have edited the note to reflect any of my changes or salient points. I have personally discussed the plan with the patient and/or  family.  Doing very well. NYHA I. No volume overload. Echo today EF 35-40% (read as 30-35%) ICD looks good  General:  Well appearing. No resp difficulty HEENT: normal Neck: supple. no JVD. Carotids 2+ bilat; no bruits. No lymphadenopathy or thryomegaly appreciated. Cor: PMI nondisplaced. Regular rate & rhythm. No rubs, gallops or murmurs. Lungs: clear Abdomen: soft, nontender, nondistended. No hepatosplenomegaly. No bruits or masses. Good bowel sounds. Extremities: no cyanosis, clubbing, rash, edema Neuro: alert & orientedx3, cranial nerves grossly intact. moves all 4 extremities w/o difficulty. Affect pleasant  Improving with medical therapy. Increase Entresto to 97/103 bid. Check labs.   Arvilla Meres, MD  2:52 PM

## 2022-02-18 NOTE — Progress Notes (Signed)
Echocardiogram 2D Echocardiogram has been performed.  Rodrigo Ran 02/18/2022, 10:45 AM

## 2022-02-18 NOTE — Patient Instructions (Signed)
Increase Entresto to 97/103  Labs done today, your results will be available in MyChart, we will contact you for abnormal readings.  Your physician recommends that you schedule a follow-up appointment in: 6 months ( December 2023)  **please call the office in September to arrange your follow up appointment. **  If you have any questions or concerns before your next appointment please send [place] a message through [place] or call our office at [phone].    TO LEAVE A MESSAGE FOR THE NURSE SELECT OPTION 2, PLEASE LEAVE A MESSAGE INCLUDING: YOUR NAME DATE OF BIRTH CALL BACK NUMBER REASON FOR CALL**this is important as we prioritize the call backs  YOU WILL RECEIVE A CALL BACK THE SAME DAY AS LONG AS YOU CALL BEFORE 4:00 PM  At the Advanced Heart Failure Clinic, you and your health needs are our priority. As part of our continuing mission to provide you with exceptional heart care, we have created designated Provider Care Teams. These Care Teams include your primary Cardiologist (physician) and Advanced Practice Providers (APPs- Physician Assistants and Nurse Practitioners) who all work together to provide you with the care you need, when you need it.   You may see any of the following providers on your designated Care Team at your next follow up: Dr Arvilla Meres Dr Carron Curie, NP Robbie Lis, [place] [facility] [place], [place] Karle Plumber, PharmD   Please be sure to bring in all your medications bottles to every appointment.

## 2022-03-08 ENCOUNTER — Other Ambulatory Visit: Payer: Self-pay | Admitting: Internal Medicine

## 2022-03-09 NOTE — Progress Notes (Signed)
Remote ICD transmission.   

## 2022-05-18 ENCOUNTER — Ambulatory Visit (INDEPENDENT_AMBULATORY_CARE_PROVIDER_SITE_OTHER): Payer: BC Managed Care – PPO

## 2022-05-18 DIAGNOSIS — I428 Other cardiomyopathies: Secondary | ICD-10-CM

## 2022-05-19 LAB — CUP PACEART REMOTE DEVICE CHECK
Battery Remaining Longevity: 132 mo
Battery Remaining Percentage: 100 %
Brady Statistic RA Percent Paced: 0 %
Brady Statistic RV Percent Paced: 0 %
Date Time Interrogation Session: 20230926184200
HighPow Impedance: 60 Ohm
Implantable Lead Implant Date: 20221228
Implantable Lead Location: 753860
Implantable Lead Model: 137
Implantable Lead Serial Number: 301300
Implantable Pulse Generator Implant Date: 20221228
Lead Channel Impedance Value: 484 Ohm
Lead Channel Pacing Threshold Amplitude: 0.7 V
Lead Channel Pacing Threshold Pulse Width: 0.4 ms
Lead Channel Setting Pacing Amplitude: 2 V
Lead Channel Setting Pacing Pulse Width: 0.4 ms
Lead Channel Setting Sensing Sensitivity: 0.5 mV
Lead Channel Setting Sensing Sensitivity: 1 mV
Pulse Gen Serial Number: 390417

## 2022-05-31 NOTE — Progress Notes (Signed)
Remote ICD transmission.   

## 2022-08-17 ENCOUNTER — Ambulatory Visit (INDEPENDENT_AMBULATORY_CARE_PROVIDER_SITE_OTHER): Payer: BC Managed Care – PPO

## 2022-08-17 DIAGNOSIS — I428 Other cardiomyopathies: Secondary | ICD-10-CM

## 2022-08-18 LAB — CUP PACEART REMOTE DEVICE CHECK
Battery Remaining Longevity: 132 mo
Battery Remaining Percentage: 100 %
Brady Statistic RA Percent Paced: 0 %
Brady Statistic RV Percent Paced: 0 %
Date Time Interrogation Session: 20231226020100
HighPow Impedance: 59 Ohm
Implantable Lead Connection Status: 753985
Implantable Lead Implant Date: 20221228
Implantable Lead Location: 753860
Implantable Lead Model: 137
Implantable Lead Serial Number: 301300
Implantable Pulse Generator Implant Date: 20221228
Lead Channel Impedance Value: 516 Ohm
Lead Channel Pacing Threshold Amplitude: 0.7 V
Lead Channel Pacing Threshold Pulse Width: 0.4 ms
Lead Channel Setting Pacing Amplitude: 2 V
Lead Channel Setting Pacing Pulse Width: 0.4 ms
Lead Channel Setting Sensing Sensitivity: 0.5 mV
Lead Channel Setting Sensing Sensitivity: 1 mV
Pulse Gen Serial Number: 390417

## 2022-08-20 IMAGING — CR DG CHEST 2V
2 series · 2 of 2 positions shown · non-contrast
Comparison: 02/04/2021

CLINICAL DATA: Cardiac device in-situ.

EXAM:
CHEST - 2 VIEW

[chest pa]
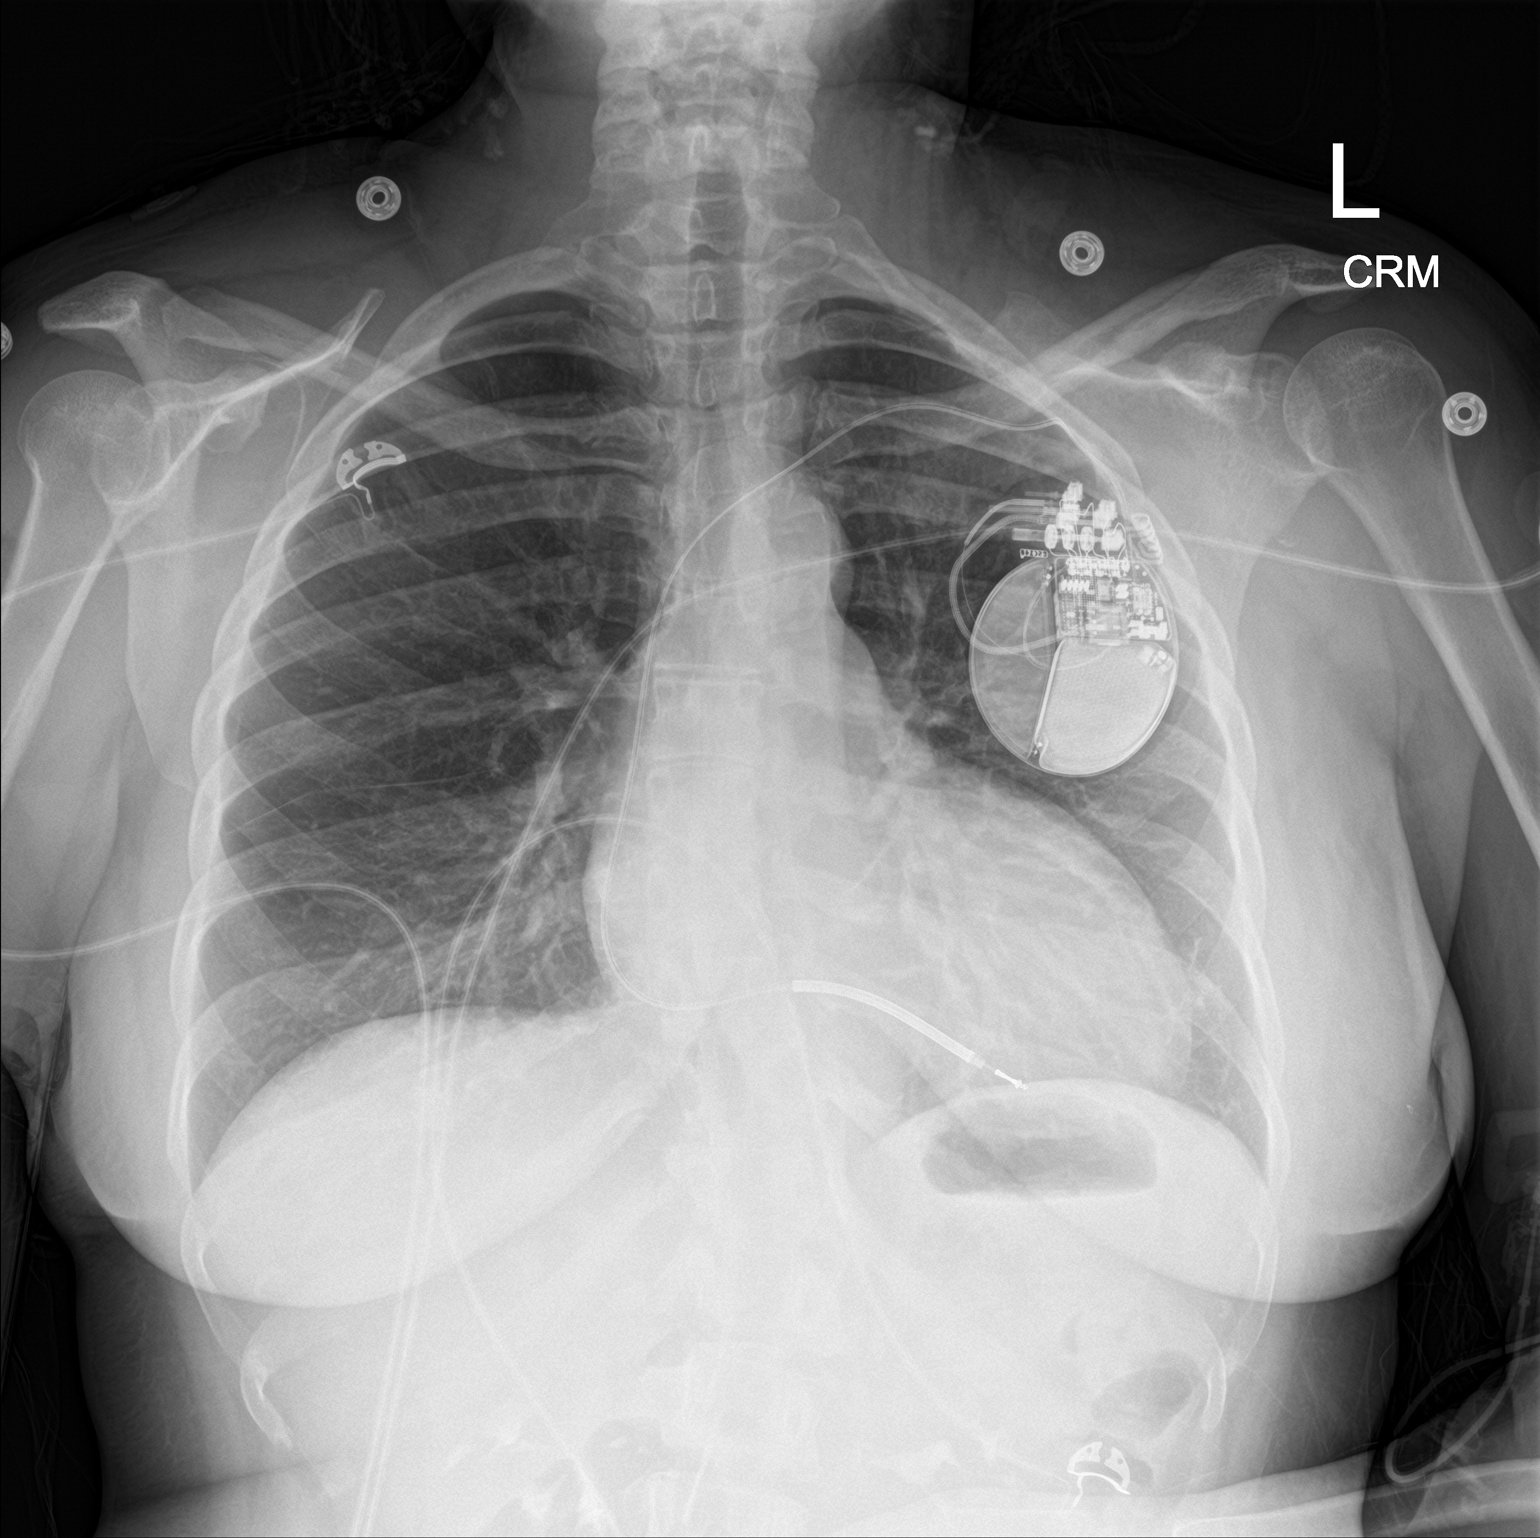

[chest lat]
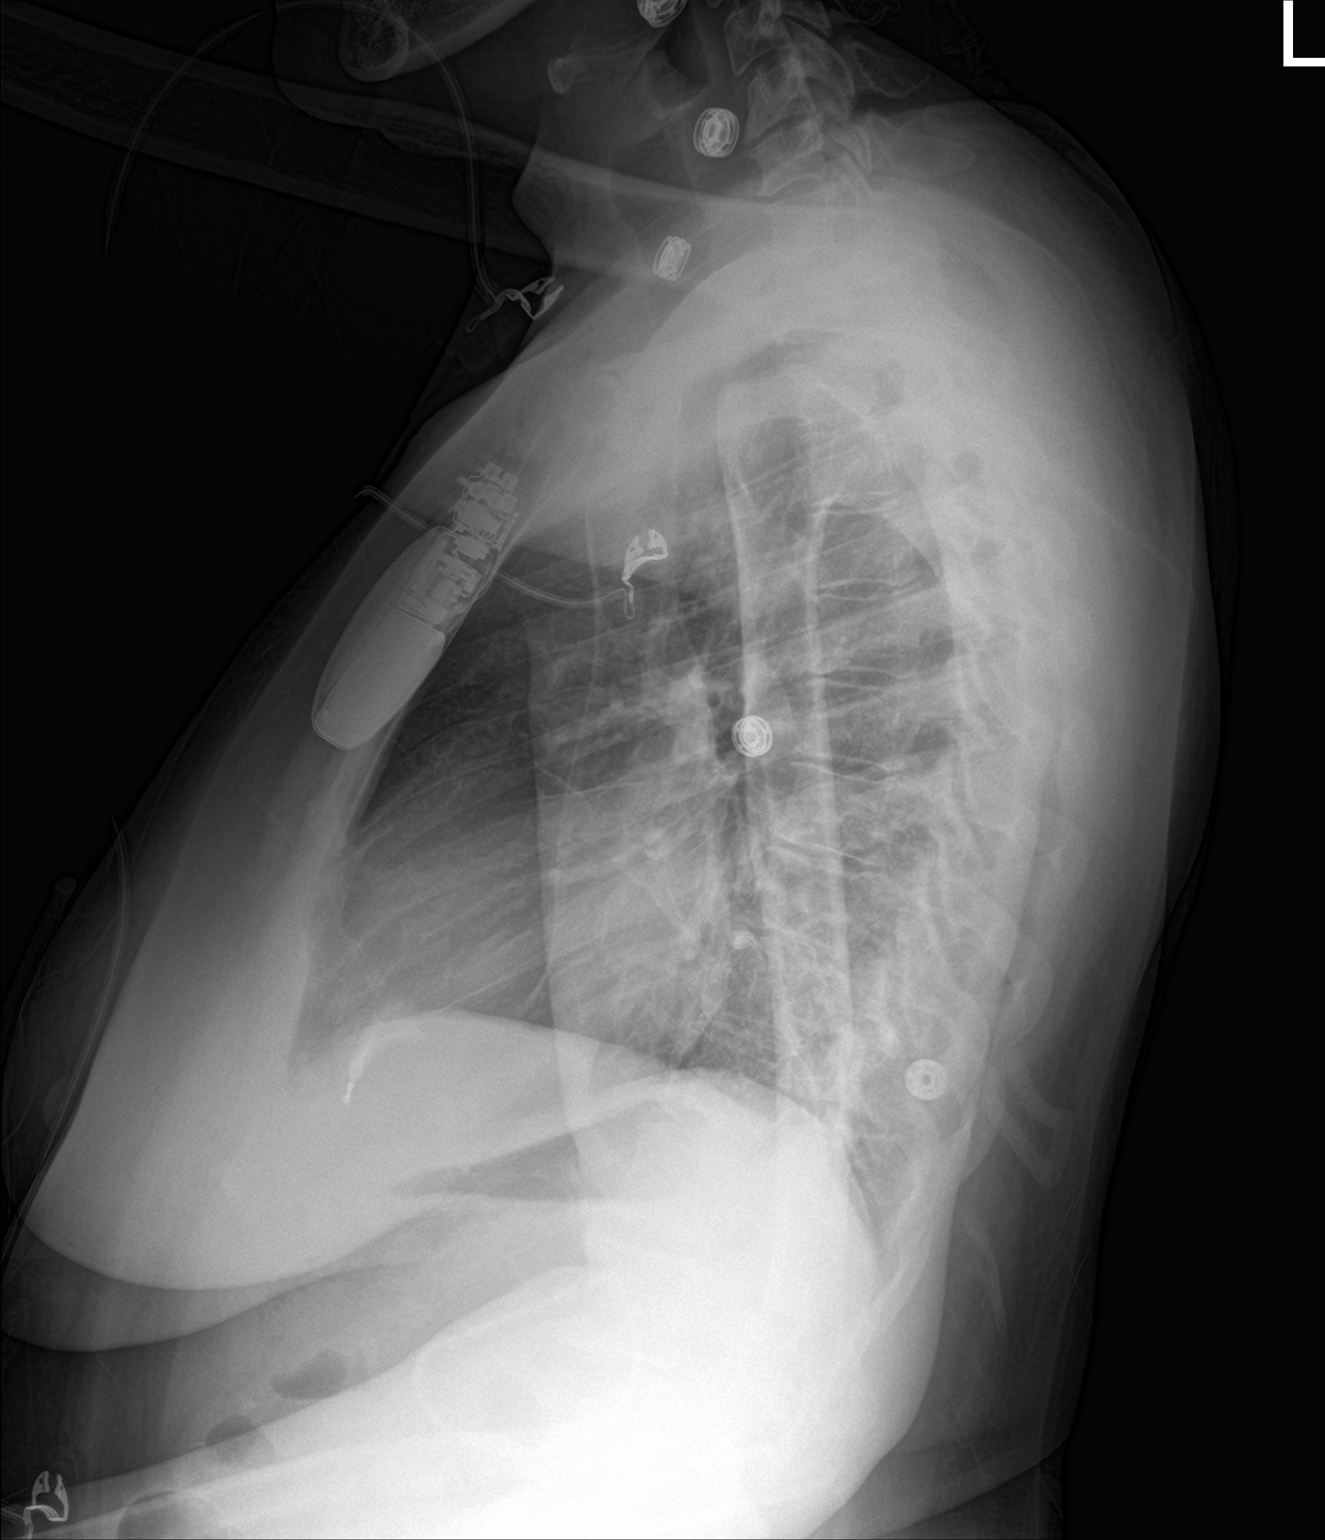

[2 of 2 positions shown; findings below may reference images not displayed]

FINDINGS: Two views of the chest demonstrate placement of a left chest single
lead ICD. Heart size is upper limits of normal. Negative for
pneumothorax. Few densities in the lower lungs could represent mild
atelectasis. Otherwise, no airspace disease or lung consolidation.
No large pleural effusions.
IMPRESSION: Placement of left chest ICD.  No complicating features.

## 2022-08-20 IMAGING — CT CT ANGIO CHEST
2 of 5 series · 17 of 46 positions shown · IV contrast (Omni 300)
Comparison: Cardiac MRI 02/06/2021

CLINICAL DATA: Assess for thoracic aortic aneurysm.

EXAM:
CT ANGIOGRAPHY CHEST WITH CONTRAST
TECHNIQUE: Multidetector CT imaging of the chest was performed using the
standard protocol during bolus administration of intravenous
contrast. Multiplanar CT image reconstructions and MIPs were
obtained to evaluate the vascular anatomy.
CONTRAST:  90mL OMNIPAQUE IOHEXOL 350 MG/ML SOLN

[Series 5: thoracic cta 2mm (person_name) · axial · 0.72mm/px · z∈[+1162,+1366]mm · 14 of 118 slices shown]
[im 8/118  lung]
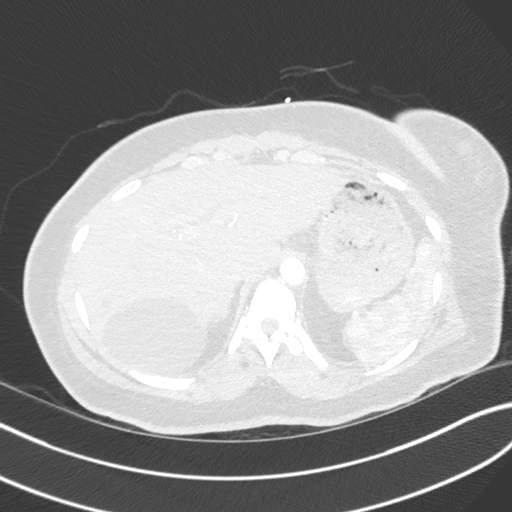
[im 16/118  soft-tissue]
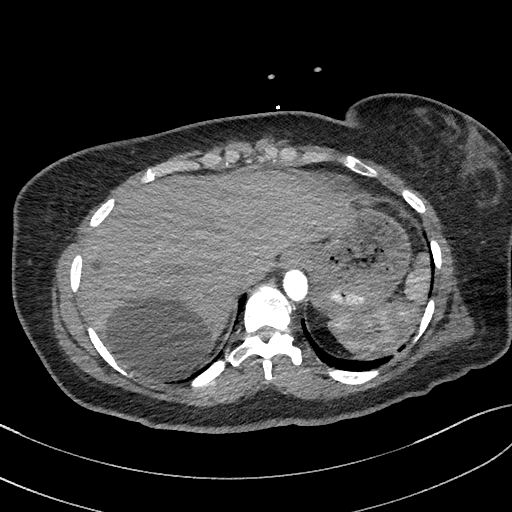
[im 23/118  lung]
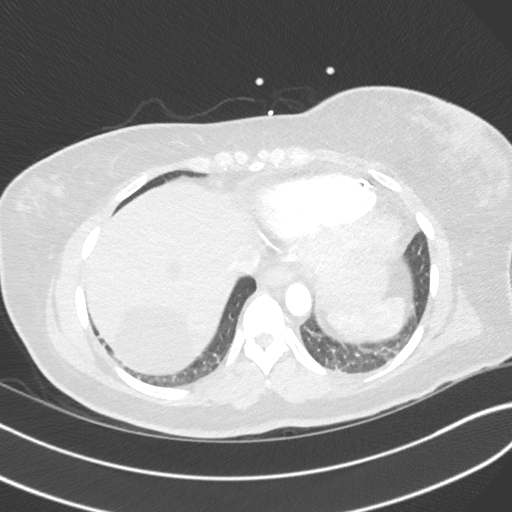
[im 31/118  soft-tissue]
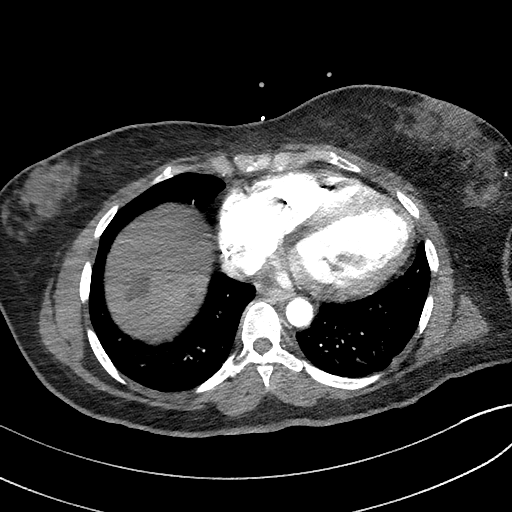
[im 38/118  lung]
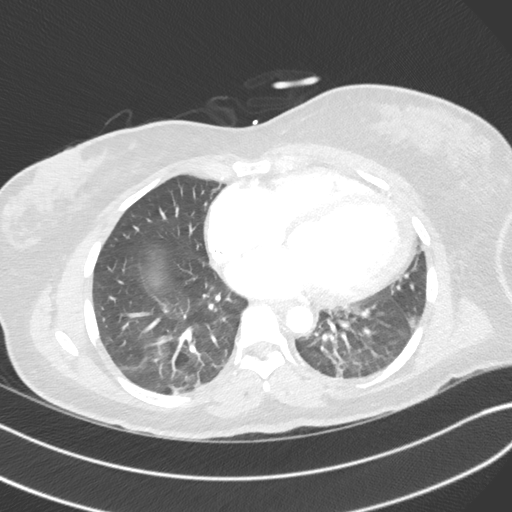
[im 46/118  soft-tissue]
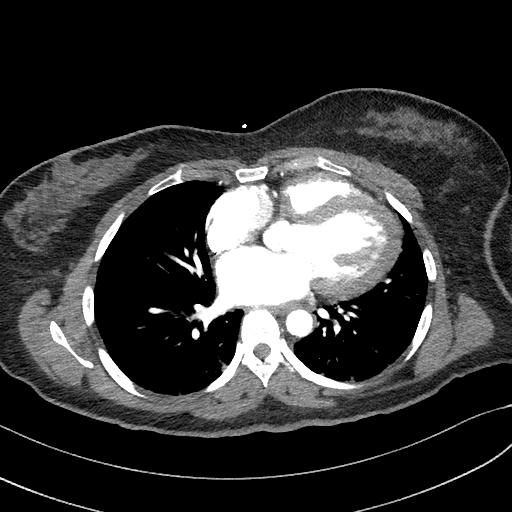
[im 53/118  lung]
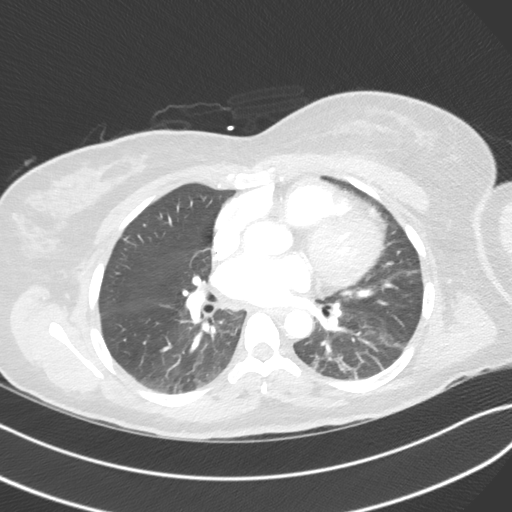
[im 65/118  soft-tissue]
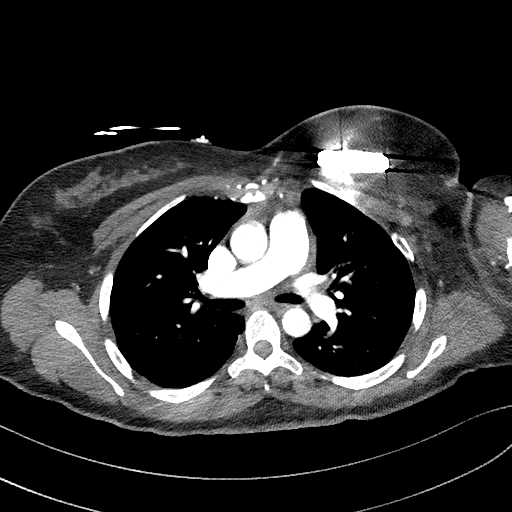
[im 72/118  lung]
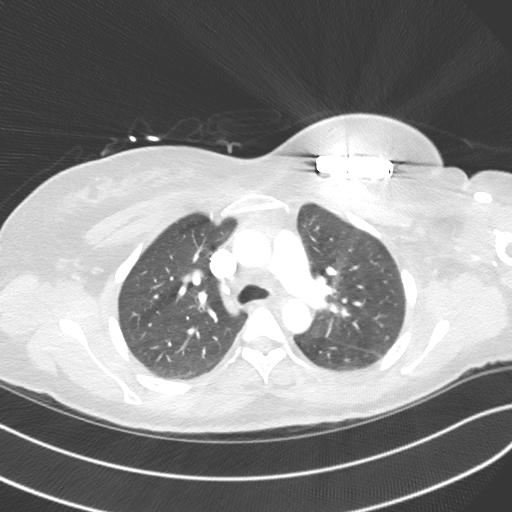
[im 80/118  soft-tissue]
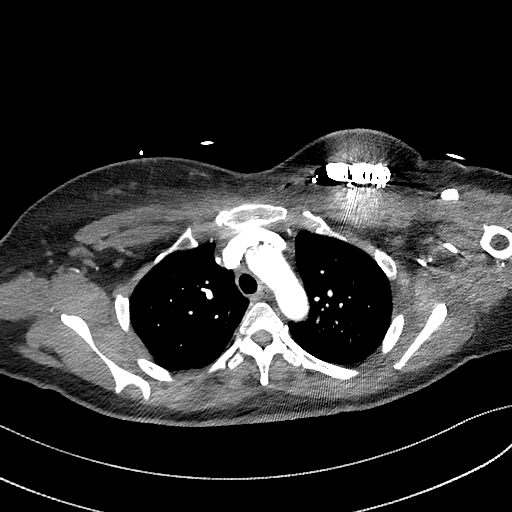
[im 87/118  lung]
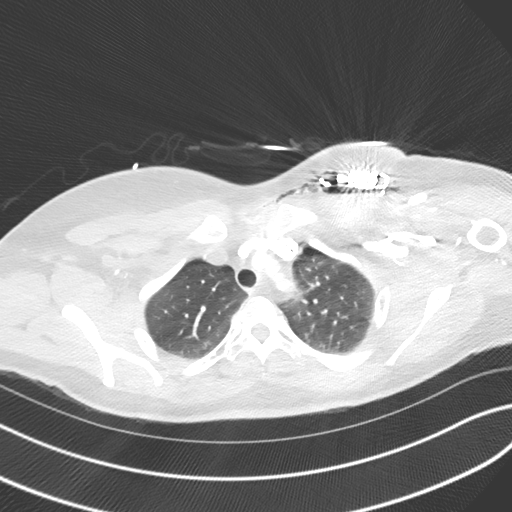
[im 95/118  soft-tissue]
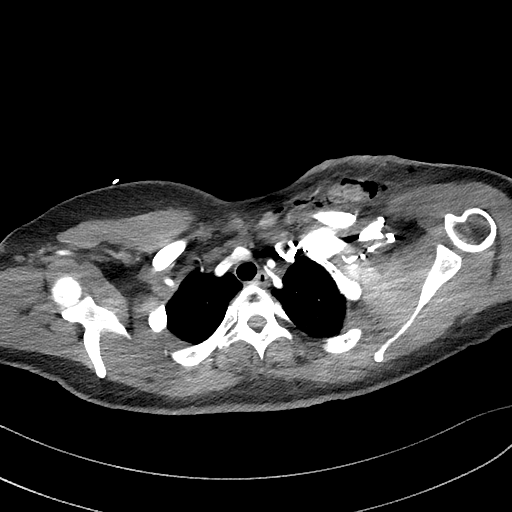
[im 102/118  lung]
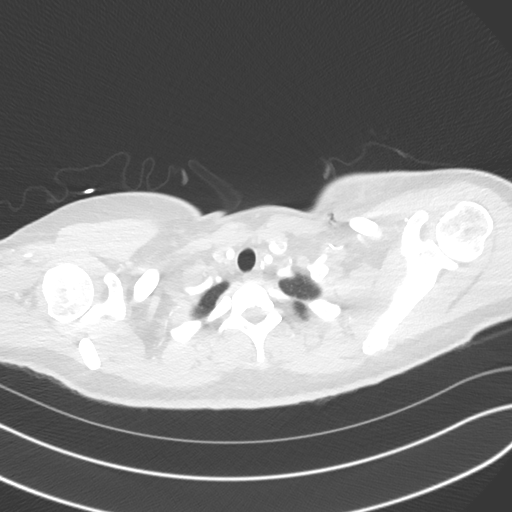
[im 110/118  soft-tissue]
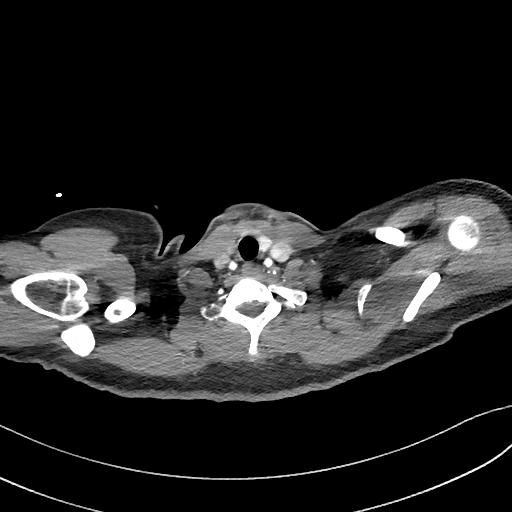

[Series 8: thoracic cta 2mm cor · coronal · 0.48mm/px · 3 of 126 slices shown]
[im 32/126  soft-tissue]
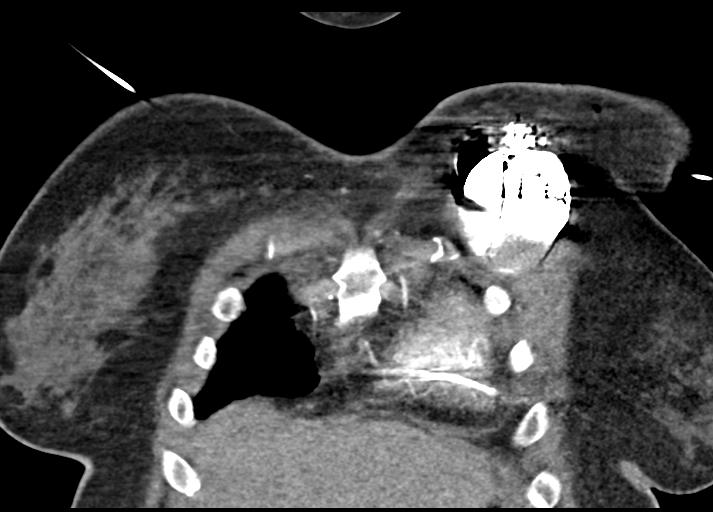
[im 63/126  soft-tissue]
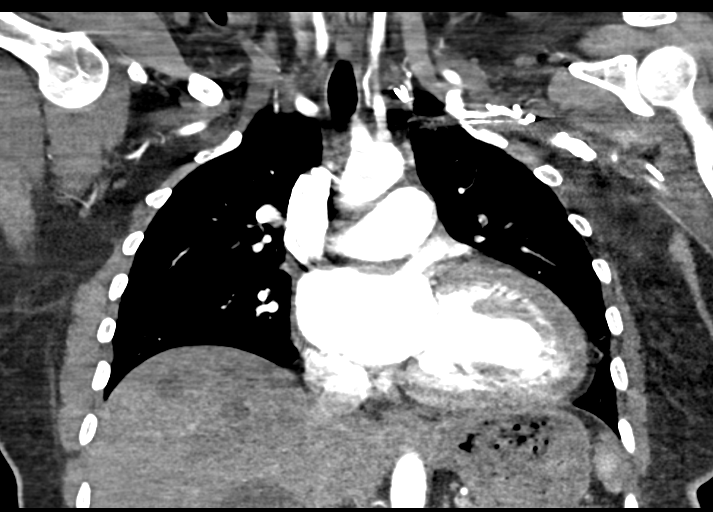
[im 94/126  soft-tissue]
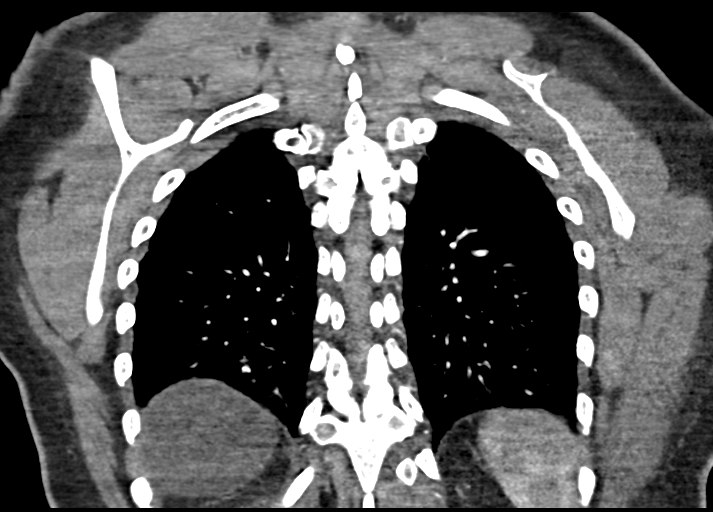

[17 of 46 positions shown; findings below may reference images not displayed]

FINDINGS: Cardiovascular: There is a left chest wall IED with lead in the
right ventricle. Mild cardiac enlargement. No pericardial effusion.
No significant aortic or coronary artery atherosclerotic
calcifications.

At the level of the sinuses the aorta measures 2.5 cm, image 54/8.
At the sinotubular junction the aorta measures 2.1 cm. The ascending
thoracic aorta measures 2.6 cm. The anterior arch measures 2.1 cm in
diameter. At the level of the posterior arch the aorta measures
cm. Proximal descending thoracic aorta measures 2 cm. Just above the
hiatus the descending thoracic aorta measures 1.8 cm.

Mediastinum/Nodes: No enlarged mediastinal, hilar, or axillary lymph
nodes. Thyroid gland, trachea, and esophagus demonstrate no
significant findings.

Lungs/Pleura: No pleural effusion or interstitial edema. No airspace
consolidation identified. No pneumothorax visualized after IUD
placement. Subsegmental atelectasis identified within the lung bases
posteriorly.

Upper Abdomen: No acute abnormality identified. Multiple cysts are
scattered throughout the liver. Dominant cyst within the posterior
right lobe of liver measures 7.7 by 5.8 cm.

Musculoskeletal: No acute osseous abnormality. Postsurgical changes
within the ventral left chest wall is identified from IED placement.

Review of the MIP images confirms the above findings.
IMPRESSION: 1. No evidence for thoracic aortic aneurysm.
2. Status post left chest wall ICD placement. No pneumothorax
identified. The ICD lead terminates in the right ventricle.
3. Liver cysts.

## 2022-09-13 NOTE — Progress Notes (Signed)
Remote ICD transmission.   

## 2022-10-14 ENCOUNTER — Ambulatory Visit (HOSPITAL_COMMUNITY)
Admission: RE | Admit: 2022-10-14 | Discharge: 2022-10-14 | Disposition: A | Discharge status: Home / Self Care | Payer: BC Managed Care – PPO | Source: Ambulatory Visit | Attending: Internal Medicine | Admitting: Internal Medicine

## 2022-10-14 ENCOUNTER — Encounter (HOSPITAL_COMMUNITY): Payer: Self-pay | Admitting: Internal Medicine

## 2022-10-14 ENCOUNTER — Telehealth (HOSPITAL_COMMUNITY): Payer: Self-pay

## 2022-10-14 VITALS — BP 108/70 | HR 64 | Wt 171.8 lb

## 2022-10-14 DIAGNOSIS — R7303 Prediabetes: Secondary | ICD-10-CM | POA: Diagnosis not present

## 2022-10-14 DIAGNOSIS — I272 Pulmonary hypertension, unspecified: Secondary | ICD-10-CM | POA: Diagnosis not present

## 2022-10-14 DIAGNOSIS — I110 Hypertensive heart disease with heart failure: Secondary | ICD-10-CM | POA: Diagnosis present

## 2022-10-14 DIAGNOSIS — E669 Obesity, unspecified: Secondary | ICD-10-CM | POA: Insufficient documentation

## 2022-10-14 DIAGNOSIS — I5042 Chronic combined systolic (congestive) and diastolic (congestive) heart failure: Secondary | ICD-10-CM | POA: Diagnosis not present

## 2022-10-14 DIAGNOSIS — Z79899 Other long term (current) drug therapy: Secondary | ICD-10-CM | POA: Insufficient documentation

## 2022-10-14 DIAGNOSIS — Z7182 Exercise counseling: Secondary | ICD-10-CM | POA: Diagnosis not present

## 2022-10-14 DIAGNOSIS — I10 Essential (primary) hypertension: Secondary | ICD-10-CM | POA: Diagnosis not present

## 2022-10-14 DIAGNOSIS — Z95810 Presence of automatic (implantable) cardiac defibrillator: Secondary | ICD-10-CM | POA: Insufficient documentation

## 2022-10-14 DIAGNOSIS — Z6832 Body mass index (BMI) 32.0-32.9, adult: Secondary | ICD-10-CM | POA: Diagnosis not present

## 2022-10-14 DIAGNOSIS — I5022 Chronic systolic (congestive) heart failure: Secondary | ICD-10-CM | POA: Diagnosis not present

## 2022-10-14 LAB — CBC
HCT: 40.6 % (ref 36.0–46.0)
Hemoglobin: 13.6 g/dL (ref 12.0–15.0)
MCH: 27.2 pg (ref 26.0–34.0)
MCHC: 33.5 g/dL (ref 30.0–36.0)
MCV: 81.2 fL (ref 80.0–100.0)
Platelets: 254 10*3/uL (ref 150–400)
RBC: 5 MIL/uL (ref 3.87–5.11)
RDW: 12.9 % (ref 11.5–15.5)
WBC: 4.1 10*3/uL (ref 4.0–10.5)
nRBC: 0 % (ref 0.0–0.2)

## 2022-10-14 LAB — BRAIN NATRIURETIC PEPTIDE: B Natriuretic Peptide: 11.5 pg/mL (ref 0.0–100.0)

## 2022-10-14 LAB — BASIC METABOLIC PANEL
Anion gap: 7 (ref 5–15)
BUN: 16 mg/dL (ref 6–20)
CO2: 26 mmol/L (ref 22–32)
Calcium: 9.2 mg/dL (ref 8.9–10.3)
Chloride: 103 mmol/L (ref 98–111)
Creatinine, Ser: 0.95 mg/dL (ref 0.44–1.00)
GFR, Estimated: >60 mL/min (ref >60)
Glucose, Bld: 102 mg/dL — ABNORMAL HIGH (ref 70–99)
Potassium: 4.6 mmol/L (ref 3.5–5.1)
Sodium: 136 mmol/L (ref 135–145)

## 2022-10-14 LAB — LIPID PANEL
Cholesterol: 172 mg/dL (ref 0–200)
HDL: 70 mg/dL (ref >40)
LDL Cholesterol: 96 mg/dL (ref 0–99)
Total CHOL/HDL Ratio: 2.5 RATIO
Triglycerides: 28 mg/dL (ref <150)
VLDL: 6 mg/dL (ref 0–40)

## 2022-10-14 LAB — TSH: TSH: 0.384 u[IU]/mL (ref 0.350–4.500)

## 2022-10-14 NOTE — Patient Instructions (Signed)
There has been no changes to your medications.  Labs done today, your results will be available in MyChart, we will contact you for abnormal readings.  Your physician has requested that you have an echocardiogram. Echocardiography is a painless test that uses sound waves to create images of your heart. It provides your doctor with information about the size and shape of your heart and how well your heart's chambers and valves are working. This procedure takes approximately one hour. There are no restrictions for this procedure. Please do NOT wear cologne, perfume, aftershave, or lotions (deodorant is allowed). Please arrive 15 minutes prior to your appointment time.  Your physician recommends that you schedule a follow-up appointment in: 6 months (August ) ** please call the office in May to arrange your follow up appointment.**  If you have any questions or concerns before your next appointment please send [place] a message through [place] or call our office at [phone].    TO LEAVE A MESSAGE FOR THE NURSE SELECT OPTION 2, PLEASE LEAVE A MESSAGE INCLUDING: YOUR NAME DATE OF BIRTH CALL BACK NUMBER REASON FOR CALL**this is important as we prioritize the call backs  YOU WILL RECEIVE A CALL BACK THE SAME DAY AS LONG AS YOU CALL BEFORE 4:00 PM  At the [facility], you and your health needs are our priority. As part of our continuing mission to provide you with exceptional heart care, we have created designated Provider Care Teams. These Care Teams include your primary Cardiologist (physician) and Advanced Practice Providers (APPs- Physician Assistants and Nurse Practitioners) who all work together to provide you with the care you need, when you need it.   You may see any of the following providers on your designated Care Team at your next follow up: Dr Glori Bickers Dr Loralie Champagne Dr. Roxana Hires, NP Lyda Jester, [place] [facility] [place],  [place] Forestine Na, NP Audry Riles, PharmD   Please be sure to bring in all your medications bottles to every appointment.    Thank you for choosing [facility]

## 2022-10-14 NOTE — Telephone Encounter (Signed)
Patient called back after looking at her BSX report and she just had a question about small print underneath the first heart failure number. She was unsure what it meant and if it had anything to do with report.

## 2022-10-14 NOTE — Progress Notes (Addendum)
Advanced Heart Failure Clinic Note   PCP: Wynelle Cleveland, MD PCP-Cardiologist: Skeet Latch, MD  HF Cardiologist: Dr. 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 She diuresed well w/ IV Lasix. R/LHC showed normal coronaries and mildly increased left-sided filling pressures with mild mixed pulmonary HTN. cMRI completed & GDMT initiated. Her discharge weight was 163 lbs.  Appointment with Dr. Broadus John for genetic testing 08/22, delayed due to insurance denial.  EF remained severely reduced at 20-25% on echo 09/22.  Underwent ICD placement 08/19/21.  Echo done today 02/18/22, EF 30-35%, RV mildly reduced   Doing well. Working [employer].  Fatigued at times but able to do ADLS without problem. No CP, SOB, orthopnea or PND.    Cardiac Studies:   - Echo (6/22): EF 20-25%, LV severely depressed, RV ok  - L/RHC (6/22): Normal cors and mildly increased left-sided filling pressures with mild mixed pulmonary HTN. Ao = 117/78 (96) LV = 118/24 RA = 8 RV = 54/12 PA = 49/22 (34) PCW = 14 Fick cardiac output/index = 4.4/2.5 PVR = 4.6 Ao sat = 98% PA sat = 67%, 70%  - cMRI (6/22): EF 23%, LGE in the left ventricular myocardium: Mid myocardial in the basal anteroseptal and inferoseptal. Mild RV systolic dysfunction (RVEF =39%).   IMPRESSION: Severely reduced LVEF, given breath hold artifact during LGE assessment sensitivity for LGE is reduced (in some PSIR views, this appears to be a septal perforator vessel).  - Echo (09/22): EF 20-25%, RV okay, mild MR  - CPX, 09/22:  Resting HR: 59 Standing HR: 60 Peak HR: 121   (71% age predicted max HR)  BP rest: 90/56 Standing BP: 82/60 BP peak: 110/62  Peak VO2: 17.9 (77% predicted peak VO2)  VE/VCO2 slope:  38  OUES: 1.32  Peak RER: 1.17  Ventilatory Threshold: 14.4 (62% predicted or measured peak VO2)  Peak RR 48  Peak  Ventilation:  58.8  VE/MVV:  65%  PETCO2 at peak:  31  O2pulse:  11   (110% predicted O2pulse)   Mild to moderate HF limitation with mild chronotropic incompetence. Body habitus contributing to limitation.   ROS: All systems reviewed and negative except as per HPI.   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 180 tablet 3   dapagliflozin propanediol (FARXIGA) 10 MG TABS tablet Take 1 tablet (10 mg total) by mouth daily before breakfast. 90 tablet 3   furosemide (LASIX) 40 MG tablet Take 1 tablet (40 mg total) by mouth daily as needed. For leg swelling or weight gain. Take for 3 lb wt gain in 24 hr or 5 lb in 1 week 30 tablet 11   Multiple Vitamins-Minerals (MULTIVITAMIN WITH MINERALS) tablet Take 1 tablet by mouth daily.     potassium chloride SA (KLOR-CON) 20 MEQ tablet Take 1 tablet (20 mEq total) by mouth daily as needed. Take only when you need to take a lasix tablet 30 tablet 5   sacubitril-valsartan (ENTRESTO) 97-103 MG Take 1  tablet by mouth 2 (two) times daily. 180 tablet 3   sodium chloride (OCEAN) 0.65 % SOLN nasal spray Place 1 spray into both nostrils daily.     spironolactone (ALDACTONE) 25 MG tablet Take 1 tablet (25 mg total) by mouth daily. 90 tablet 3   No current facility-administered medications for this encounter.   Allergies  Allergen Reactions   Tape Dermatitis    Pt states she is allergic to the clear tape that was applied to her wrist after her heart cath    Social History   Socioeconomic History   Marital status: Single    Spouse name: Not on file   Number of children: Not on file   Years of education: Not on file    Highest education level: Not on file  Occupational History   Not on file  Tobacco Use   Smoking status: Never   Smokeless tobacco: Never  Substance and Sexual Activity   Alcohol use: Yes    Comment: 1-2 per week   Drug use: No   Sexual activity: Yes  Other Topics Concern   Not on file  Social History Narrative   Not on file   Social Determinants of Health   Financial Resource Strain: Not on file  Food Insecurity: Not on file  Transportation Needs: Not on file  Physical Activity: Not on file  Stress: Not on file  Social Connections: Not on file  Intimate Partner Violence: Not on file   Family History  Problem Relation Age of Onset   Hypertension Mother    CAD Mother    Prostate cancer Father    Heart failure Maternal Aunt    Heart failure Maternal Aunt    Heart failure Maternal Aunt    Heart failure Maternal Uncle    Anemia Neg Hx    Arrhythmia Neg Hx    Asthma Neg Hx    Clotting disorder Neg Hx    Fainting Neg Hx    Hyperlipidemia Neg Hx    BP 108/70   Pulse 64   Wt 77.9 kg (171 lb 12.8 oz)   SpO2 97%   BMI 32.46 kg/m   Wt Readings from Last 3 Encounters:  10/14/22 77.9 kg (171 lb 12.8 oz)  02/18/22 75.4 kg (166 lb 3.2 oz)  11/23/21 74.8 kg (165 lb)   PHYSICAL EXAM: General:  Well appearing. No resp difficulty HEENT: normal Neck: supple. no JVD. Carotids 2+ bilat; no bruits. No lymphadenopathy or thryomegaly appreciated. Cor: PMI nondisplaced. Regular rate & rhythm. No rubs, gallops or murmurs. Lungs: clear Abdomen: soft, nontender, nondistended. No hepatosplenomegaly. No bruits or masses. Good bowel sounds. Extremities: no cyanosis, clubbing, rash, edema Neuro: alert & orientedx3, cranial nerves grossly intact. moves all 4 extremities w/o difficulty. Affect pleasant  ICD interrogation: HL score 0. No VT/AF Personally reviewed   ASSESSMENT & PLAN: Chronic systolic Heart Failure (New): - Echo (6/22): EF 20-25%, GIIDD, RV normal. + RWMAs (AK LV,  entire inferoseptal wall, apical segment and inferior wall) - R/LHC (6/22): normal cors and mildly increased left-sided filling pressures with mild mixed pulmonary HTN. - cMRI (6/22): Severely reduced systolic dysfunction LVEF [id number], mild RV systolic dysfunction RVEF 39%, breath hold artifact dueing LGE assessment - Echo (09/22): EF 20-25% - CPX 09/22: Mild to moderate HF limitation with mild chronotropic incompetence, body habitus also contributing to limitation - Genetic testing incomplete d/t insurance denial. 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 - Echo  02/18/22, EF 35-40%,  (read as 30-35%) RV mildly reduced  - s/p ICD 08/19/21. No VT/VF on device interrogation  - NYHA I-II. Euvolemic on exam  - ICD interrogated personally. Volume ok. No VT/AF HL score 0 - Continue Lasix 40 mg PRN  - Continue carvedilol 3.125 mg bid.  - Continue Farxiga 10 mg daily. - Continue Entresto 97/103 mg BID - Continue spiro 25 mg daily. - Labs today. Due for repeat echo    2. HTN: - Blood pressure well controlled. Continue current regimen.   3. Snoring - Had sleep study 3/23. Normal study   4. Obesity: - Body mass index is 32.46 kg/m. - Encouraged dietary changes and gentle exercise to aid in weight reduction.  5. Pre-Diabetes: - A1c 6.3 06/22.  - On Farxiga.   Glori Bickers, MD 10/14/22

## 2022-10-15 NOTE — Telephone Encounter (Signed)
I spoke with patient and updated her on "fine print" after talk with Emer. We discussed how the transmissions work and she was agreeable.

## 2022-10-19 ENCOUNTER — Telehealth: Payer: Self-pay | Admitting: *Deleted

## 2022-10-19 NOTE — Telephone Encounter (Signed)
Order ID: [id number]       Authorized  Approval Valid Through: 10/19/2022 - [id number]  Echo pre cert

## 2022-11-03 ENCOUNTER — Ambulatory Visit (HOSPITAL_COMMUNITY): Payer: BC Managed Care – PPO

## 2022-11-05 ENCOUNTER — Ambulatory Visit (HOSPITAL_COMMUNITY)
Admission: RE | Admit: 2022-11-05 | Discharge: 2022-11-05 | Disposition: A | Discharge status: Home / Self Care | Payer: BC Managed Care – PPO | Source: Ambulatory Visit | Attending: Internal Medicine | Admitting: Internal Medicine

## 2022-11-05 DIAGNOSIS — I110 Hypertensive heart disease with heart failure: Secondary | ICD-10-CM | POA: Diagnosis present

## 2022-11-05 DIAGNOSIS — I5040 Unspecified combined systolic (congestive) and diastolic (congestive) heart failure: Secondary | ICD-10-CM | POA: Diagnosis not present

## 2022-11-05 DIAGNOSIS — I5042 Chronic combined systolic (congestive) and diastolic (congestive) heart failure: Secondary | ICD-10-CM | POA: Diagnosis not present

## 2022-11-05 DIAGNOSIS — Z950 Presence of cardiac pacemaker: Secondary | ICD-10-CM | POA: Insufficient documentation

## 2022-11-05 LAB — ECHOCARDIOGRAM COMPLETE
Area-P 1/2: 4.74 cm2
Calc EF: 52.4 %
S' Lateral: 3.3 cm
Single Plane A2C EF: 53.9 %
Single Plane A4C EF: 51.2 %

## 2022-11-05 NOTE — Progress Notes (Signed)
Echocardiogram 2D Echocardiogram has been performed.  Frances Furbish 11/05/2022, 11:28 AM

## 2022-11-15 ENCOUNTER — Encounter: Payer: Self-pay | Admitting: Internal Medicine

## 2022-11-16 ENCOUNTER — Ambulatory Visit (INDEPENDENT_AMBULATORY_CARE_PROVIDER_SITE_OTHER): Payer: BC Managed Care – PPO

## 2022-11-16 DIAGNOSIS — I429 Cardiomyopathy, unspecified: Secondary | ICD-10-CM | POA: Diagnosis not present

## 2022-11-16 LAB — CUP PACEART REMOTE DEVICE CHECK
Battery Remaining Longevity: 132 mo
Battery Remaining Percentage: 100 %
Brady Statistic RA Percent Paced: 0 %
Brady Statistic RV Percent Paced: 0 %
Date Time Interrogation Session: 20240326020100
HighPow Impedance: 58 Ohm
Implantable Lead Connection Status: 753985
Implantable Lead Implant Date: 20221228
Implantable Lead Location: 753860
Implantable Lead Model: 137
Implantable Lead Serial Number: 301300
Implantable Pulse Generator Implant Date: 20221228
Lead Channel Impedance Value: 515 Ohm
Lead Channel Pacing Threshold Amplitude: 0.6 V
Lead Channel Pacing Threshold Pulse Width: 0.4 ms
Lead Channel Setting Pacing Amplitude: 2 V
Lead Channel Setting Pacing Pulse Width: 0.4 ms
Lead Channel Setting Sensing Sensitivity: 0.5 mV
Lead Channel Setting Sensing Sensitivity: 1 mV
Pulse Gen Serial Number: 390417

## 2022-12-07 ENCOUNTER — Other Ambulatory Visit (HOSPITAL_COMMUNITY): Payer: Self-pay

## 2022-12-07 ENCOUNTER — Telehealth (HOSPITAL_COMMUNITY): Payer: Self-pay | Admitting: Pharmacy Technician

## 2022-12-07 NOTE — Telephone Encounter (Signed)
Advanced Heart Failure Patient Advocate Encounter  Prior Authorization for Sherryll Burger has been submitted and approved.    PA# [id number] Effective dates: 12/07/22 through 12/07/23  Archer Asa, CPhT

## 2022-12-28 NOTE — Progress Notes (Signed)
Remote ICD transmission.   

## 2023-02-13 ENCOUNTER — Other Ambulatory Visit (HOSPITAL_COMMUNITY): Payer: Self-pay | Admitting: Internal Medicine

## 2023-02-14 ENCOUNTER — Other Ambulatory Visit (HOSPITAL_COMMUNITY): Payer: Self-pay | Admitting: Internal Medicine

## 2023-02-15 ENCOUNTER — Ambulatory Visit (INDEPENDENT_AMBULATORY_CARE_PROVIDER_SITE_OTHER): Payer: BC Managed Care – PPO

## 2023-02-15 DIAGNOSIS — I429 Cardiomyopathy, unspecified: Secondary | ICD-10-CM | POA: Diagnosis not present

## 2023-02-17 LAB — CUP PACEART REMOTE DEVICE CHECK
Battery Remaining Longevity: 132 mo
Battery Remaining Percentage: 100 %
Brady Statistic RA Percent Paced: 0 %
Brady Statistic RV Percent Paced: 0 %
Date Time Interrogation Session: 20240627072600
HighPow Impedance: 60 Ohm
Implantable Lead Connection Status: 753985
Implantable Lead Implant Date: 20221228
Implantable Lead Location: 753860
Implantable Lead Model: 137
Implantable Lead Serial Number: 301300
Implantable Pulse Generator Implant Date: 20221228
Lead Channel Impedance Value: 525 Ohm
Lead Channel Pacing Threshold Amplitude: 0.8 V
Lead Channel Pacing Threshold Pulse Width: 0.4 ms
Lead Channel Setting Pacing Amplitude: 2 V
Lead Channel Setting Pacing Pulse Width: 0.4 ms
Lead Channel Setting Sensing Sensitivity: 0.5 mV
Lead Channel Setting Sensing Sensitivity: 1 mV
Pulse Gen Serial Number: 390417

## 2023-03-11 NOTE — Progress Notes (Signed)
Remote ICD transmission.   

## 2023-05-17 ENCOUNTER — Ambulatory Visit (INDEPENDENT_AMBULATORY_CARE_PROVIDER_SITE_OTHER): Payer: BC Managed Care – PPO

## 2023-05-17 DIAGNOSIS — I5042 Chronic combined systolic (congestive) and diastolic (congestive) heart failure: Secondary | ICD-10-CM

## 2023-05-17 DIAGNOSIS — I429 Cardiomyopathy, unspecified: Secondary | ICD-10-CM

## 2023-05-24 LAB — CUP PACEART REMOTE DEVICE CHECK
Battery Remaining Longevity: 132 mo
Battery Remaining Percentage: 100 %
Brady Statistic RA Percent Paced: 0 %
Brady Statistic RV Percent Paced: 0 %
Date Time Interrogation Session: 20240930073300
HighPow Impedance: 58 Ohm
Implantable Lead Connection Status: 753985
Implantable Lead Implant Date: 20221228
Implantable Lead Location: 753860
Implantable Lead Model: 137
Implantable Lead Serial Number: 301300
Implantable Pulse Generator Implant Date: 20221228
Lead Channel Impedance Value: 517 Ohm
Lead Channel Pacing Threshold Amplitude: 0.6 V
Lead Channel Pacing Threshold Pulse Width: 0.4 ms
Lead Channel Setting Pacing Amplitude: 2 V
Lead Channel Setting Pacing Pulse Width: 0.4 ms
Lead Channel Setting Sensing Sensitivity: 0.5 mV
Lead Channel Setting Sensing Sensitivity: 1 mV
Pulse Gen Serial Number: 390417

## 2023-06-03 NOTE — Progress Notes (Signed)
Remote ICD transmission.   

## 2023-08-16 ENCOUNTER — Ambulatory Visit (INDEPENDENT_AMBULATORY_CARE_PROVIDER_SITE_OTHER): Payer: BC Managed Care – PPO

## 2023-08-16 DIAGNOSIS — I5042 Chronic combined systolic (congestive) and diastolic (congestive) heart failure: Secondary | ICD-10-CM

## 2023-08-16 DIAGNOSIS — I429 Cardiomyopathy, unspecified: Secondary | ICD-10-CM | POA: Diagnosis not present

## 2023-08-16 LAB — CUP PACEART REMOTE DEVICE CHECK
Battery Remaining Longevity: 132 mo
Battery Remaining Percentage: 100 %
Brady Statistic RA Percent Paced: 0 %
Brady Statistic RV Percent Paced: 0 %
Date Time Interrogation Session: 20241224020200
HighPow Impedance: 61 Ohm
Implantable Lead Connection Status: 753985
Implantable Lead Implant Date: 20221228
Implantable Lead Location: 753860
Implantable Lead Model: 137
Implantable Lead Serial Number: 301300
Implantable Pulse Generator Implant Date: 20221228
Lead Channel Impedance Value: 522 Ohm
Lead Channel Pacing Threshold Amplitude: 0.6 V
Lead Channel Pacing Threshold Pulse Width: 0.4 ms
Lead Channel Setting Pacing Amplitude: 2 V
Lead Channel Setting Pacing Pulse Width: 0.4 ms
Lead Channel Setting Sensing Sensitivity: 0.5 mV
Lead Channel Setting Sensing Sensitivity: 1 mV
Pulse Gen Serial Number: 390417

## 2023-09-21 ENCOUNTER — Encounter: Payer: Self-pay | Admitting: Internal Medicine

## 2023-09-28 NOTE — Progress Notes (Signed)
 Remote ICD transmission.

## 2023-11-15 ENCOUNTER — Ambulatory Visit (INDEPENDENT_AMBULATORY_CARE_PROVIDER_SITE_OTHER): Payer: BC Managed Care – PPO

## 2023-11-15 DIAGNOSIS — I429 Cardiomyopathy, unspecified: Secondary | ICD-10-CM | POA: Diagnosis not present

## 2023-11-15 LAB — CUP PACEART REMOTE DEVICE CHECK
Battery Remaining Longevity: 132 mo
Battery Remaining Percentage: 100 %
Brady Statistic RA Percent Paced: 0 %
Brady Statistic RV Percent Paced: 0 %
Date Time Interrogation Session: 20250325020000
HighPow Impedance: 59 Ohm
Implantable Lead Connection Status: 753985
Implantable Lead Implant Date: 20221228
Implantable Lead Location: 753860
Implantable Lead Model: 137
Implantable Lead Serial Number: 301300
Implantable Pulse Generator Implant Date: 20221228
Lead Channel Impedance Value: 532 Ohm
Lead Channel Pacing Threshold Amplitude: 0.6 V
Lead Channel Pacing Threshold Pulse Width: 0.4 ms
Lead Channel Setting Pacing Amplitude: 2 V
Lead Channel Setting Pacing Pulse Width: 0.4 ms
Lead Channel Setting Sensing Sensitivity: 0.5 mV
Lead Channel Setting Sensing Sensitivity: 1 mV
Pulse Gen Serial Number: 390417

## 2023-12-10 ENCOUNTER — Encounter: Payer: Self-pay | Admitting: Internal Medicine

## 2023-12-30 NOTE — Progress Notes (Signed)
 Remote ICD transmission.

## 2024-01-30 ENCOUNTER — Encounter (HOSPITAL_COMMUNITY): Payer: Self-pay | Admitting: Internal Medicine

## 2024-02-05 ENCOUNTER — Other Ambulatory Visit (HOSPITAL_COMMUNITY): Payer: Self-pay | Admitting: Internal Medicine

## 2024-02-06 ENCOUNTER — Telehealth (HOSPITAL_COMMUNITY): Payer: Self-pay | Admitting: Internal Medicine

## 2024-02-10 ENCOUNTER — Telehealth (HOSPITAL_COMMUNITY): Payer: Self-pay | Admitting: Internal Medicine

## 2024-02-10 NOTE — Telephone Encounter (Signed)
 Called to confirm/remind patient of their appointment at the Advanced [facility] Failure Clinic on 02/10/2024.   Appointment:   [x] Confirmed  [] Left mess   [] No answer/No voice mail  [] VM Full/unable to leave message  [] Phone not in service  Patient reminded to bring all medications and/or complete list.  Confirmed patient has transportation. Gave directions, instructed to utilize valet parking.

## 2024-02-12 NOTE — Progress Notes (Signed)
 Advanced Heart Failure Clinic Note   PCP: Loralyn Cough, MD PCP-Cardiologist: Annabella Scarce, MD  HF Cardiologist: Dr. 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 EF 20-25%  R/LHC showed normal coronaries and mildly increased left-sided filling pressures with mild mixed pulmonary HTN. cMRI EF 23%  Appointment with Dr. Fairy for genetic testing 08/22, delayed due to insurance denial.  Echo 9/22 EF 20-25%  Underwent ICD placement 08/19/21.  Echo 02/18/22, EF 30-35%, RV mildly reduced   Last seen in HF clinic 2/24  Doing well. Working [employer].  Fatigued at times but able to do ADLS without problem. No CP, SOB, orthopnea or PND.    Cardiac Studies:   - Echo (6/22): EF 20-25%, LV severely depressed, RV ok  - L/RHC (6/22): Normal cors and mildly increased left-sided filling pressures with mild mixed pulmonary HTN. Ao = 117/78 (96) LV = 118/24 RA = 8 RV = 54/12 PA = 49/22 (34) PCW = 14 Fick cardiac output/index = 4.4/2.5 PVR = 4.6 Ao sat = 98% PA sat = 67%, 70%  - cMRI (6/22): EF 23%, LGE in the left ventricular myocardium: Mid myocardial in the basal anteroseptal and inferoseptal. Mild RV systolic dysfunction (RVEF =39%).   IMPRESSION: Severely reduced LVEF, given breath hold artifact during LGE assessment sensitivity for LGE is reduced (in some PSIR views, this appears to be a septal perforator vessel).  - Echo (09/22): EF 20-25%, RV okay, mild MR  - CPX, 09/22:  Resting HR: 59 Standing HR: 60 Peak HR: 121   (71% age predicted max HR)  BP rest: 90/56 Standing BP: 82/60 BP peak: 110/62  Peak VO2: 17.9 (77% predicted peak VO2)  VE/VCO2 slope:  38  OUES: 1.32  Peak RER: 1.17  Ventilatory Threshold: 14.4 (62% predicted or measured peak VO2)  Peak RR 48  Peak Ventilation:  58.8  VE/MVV:  65%  PETCO2 at peak:  31  O2pulse:  11   (110% predicted O2pulse)   Mild to  moderate HF limitation with mild chronotropic incompetence. Body habitus contributing to limitation.   ROS: All systems reviewed and negative except as per HPI.   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 For leg swelling or weight gain. Take for 3 lb wt gain in 24 hr or 5 lb in 1 week 30 tablet 11   Multiple Vitamins-Minerals (MULTIVITAMIN WITH MINERALS) tablet Take 1 tablet by mouth daily.     potassium chloride  SA (KLOR-CON ) 20 MEQ tablet Take 1 tablet (20 mEq total) by mouth daily as needed. Take only when you need to take a lasix  tablet 30 tablet 5   sacubitril -valsartan  (ENTRESTO ) 97-103 MG Take 1 tablet by mouth twice daily 60 tablet 1   sodium chloride  (OCEAN) 0.65 % SOLN nasal spray Place 1 spray into both nostrils daily.  spironolactone  (ALDACTONE ) 25 MG tablet Take 1 tablet by mouth once daily 30 tablet 1   No current facility-administered medications for this visit.   Allergies  Allergen Reactions   Tape Dermatitis    Pt states she is allergic to the clear tape that was applied to her wrist after her heart cath    Social History   Socioeconomic History   Marital status: Single    Spouse name: Not on file   Number of children: Not on file   Years of education: Not on file   Highest education level: Not on file  Occupational History   Not on file  Tobacco Use   Smoking status: Never   Smokeless tobacco: Never  Substance and Sexual Activity   Alcohol use: Yes     Comment: 1-2 per week   Drug use: No   Sexual activity: Yes  Other Topics Concern   Not on file  Social History Narrative   Not on file   Social Drivers of Health   Financial Resource Strain: Not on file  Food Insecurity: Not on file  Transportation Needs: Not on file  Physical Activity: Not on file  Stress: Not on file  Social Connections: Not on file  Intimate Partner Violence: Not on file   Family History  Problem Relation Age of Onset   Hypertension Mother    CAD Mother    Prostate cancer Father    Heart failure Maternal Aunt    Heart failure Maternal Aunt    Heart failure Maternal Aunt    Heart failure Maternal Uncle    Anemia Neg Hx    Arrhythmia Neg Hx    Asthma Neg Hx    Clotting disorder Neg Hx    Fainting Neg Hx    Hyperlipidemia Neg Hx    There were no vitals taken for this visit.  Wt Readings from Last 3 Encounters:  10/14/22 77.9 kg (171 lb 12.8 oz)  02/18/22 75.4 kg (166 lb 3.2 oz)  11/23/21 74.8 kg (165 lb)   PHYSICAL EXAM: General:  Well appearing. No resp difficulty HEENT: normal Neck: supple. no JVD. Carotids 2+ bilat; no bruits. No lymphadenopathy or thryomegaly appreciated. Cor: PMI nondisplaced. Regular rate & rhythm. No rubs, gallops or murmurs. Lungs: clear Abdomen: soft, nontender, nondistended. No hepatosplenomegaly. No bruits or masses. Good bowel sounds. Extremities: no cyanosis, clubbing, rash, edema Neuro: alert & orientedx3, cranial nerves grossly intact. moves all 4 extremities w/o difficulty. Affect pleasant  ICD interrogation: HL score 0. No VT/AF Personally reviewed   ASSESSMENT & PLAN: Chronic systolic Heart Failure due to probable familial CM: - Echo (6/22): EF 20-25%, GIIDD, RV normal. + RWMAs (AK LV, entire inferoseptal wall, apical segment and inferior wall) - R/LHC (6/22): normal cors and mildly increased left-sided filling pressures with mild mixed pulmonary HTN. - cMRI (6/22):  LVEF 23%, mild RV systolic dysfunction  RVEF 39%, breath hold artifact dueing LGE assessment - Echo (09/22): EF 20-25% - CPX 09/22: Mild to moderate HF limitation - Genetic testing incomplete d/t insurance denial. Pedigree suggests malignant autosomal dominant condition. - Echo  02/18/22, EF 35-40%,  (read as 30-35%) RV mildly reduced  - s/p ICD 08/19/21. No VT/VF on device interrogation  - NYHA I-II. Euvolemic on exam  - ICD interrogated personally. Volume ok. No VT/AF HL score 0 - Continue Lasix  40 mg PRN  - Continue carvedilol  3.125 mg bid.  - Continue Farxiga  10 mg daily. - Continue Entresto  97/103 mg BID - Continue  spiro 25 mg daily.    2. HTN: - Blood pressure well controlled. Continue current regimen.   3. Snoring - Had sleep study 3/23. Normal study   4. Obesity: - There is no height or weight on file to calculate BMI. - Encouraged dietary changes and gentle exercise to aid in weight reduction.  5. Pre-Diabetes: - A1c 6.3 06/22.  - On Farxiga .   Toribio Fuel, MD 02/12/24

## 2024-02-13 ENCOUNTER — Ambulatory Visit (HOSPITAL_COMMUNITY)
Admission: RE | Admit: 2024-02-13 | Discharge: 2024-02-13 | Disposition: A | Discharge status: Home / Self Care | Source: Ambulatory Visit | Attending: Internal Medicine | Admitting: Internal Medicine

## 2024-02-13 VITALS — BP 112/80 | HR 55 | Wt 170.4 lb

## 2024-02-13 DIAGNOSIS — R7303 Prediabetes: Secondary | ICD-10-CM | POA: Insufficient documentation

## 2024-02-13 DIAGNOSIS — I5042 Chronic combined systolic (congestive) and diastolic (congestive) heart failure: Secondary | ICD-10-CM | POA: Diagnosis not present

## 2024-02-13 DIAGNOSIS — Z7984 Long term (current) use of oral hypoglycemic drugs: Secondary | ICD-10-CM | POA: Diagnosis not present

## 2024-02-13 DIAGNOSIS — E669 Obesity, unspecified: Secondary | ICD-10-CM | POA: Insufficient documentation

## 2024-02-13 DIAGNOSIS — I10 Essential (primary) hypertension: Secondary | ICD-10-CM

## 2024-02-13 DIAGNOSIS — I110 Hypertensive heart disease with heart failure: Secondary | ICD-10-CM | POA: Diagnosis not present

## 2024-02-13 DIAGNOSIS — Z6832 Body mass index (BMI) 32.0-32.9, adult: Secondary | ICD-10-CM | POA: Insufficient documentation

## 2024-02-13 DIAGNOSIS — I5022 Chronic systolic (congestive) heart failure: Secondary | ICD-10-CM | POA: Diagnosis present

## 2024-02-13 DIAGNOSIS — Z4502 Encounter for adjustment and management of automatic implantable cardiac defibrillator: Secondary | ICD-10-CM | POA: Insufficient documentation

## 2024-02-13 DIAGNOSIS — Z79899 Other long term (current) drug therapy: Secondary | ICD-10-CM | POA: Insufficient documentation

## 2024-02-13 DIAGNOSIS — R413 Other amnesia: Secondary | ICD-10-CM

## 2024-02-13 DIAGNOSIS — R0683 Snoring: Secondary | ICD-10-CM | POA: Insufficient documentation

## 2024-02-13 DIAGNOSIS — Z95810 Presence of automatic (implantable) cardiac defibrillator: Secondary | ICD-10-CM

## 2024-02-13 LAB — BASIC METABOLIC PANEL WITH GFR
Anion gap: 11 (ref 5–15)
BUN: 17 mg/dL (ref 6–20)
CO2: 26 mmol/L (ref 22–32)
Calcium: 9.7 mg/dL (ref 8.9–10.3)
Chloride: 102 mmol/L (ref 98–111)
Creatinine, Ser: 0.84 mg/dL (ref 0.44–1.00)
GFR, Estimated: >60 mL/min (ref >60)
Glucose, Bld: 91 mg/dL (ref 70–99)
Potassium: 4.3 mmol/L (ref 3.5–5.1)
Sodium: 139 mmol/L (ref 135–145)

## 2024-02-13 LAB — BRAIN NATRIURETIC PEPTIDE: B Natriuretic Peptide: 6.4 pg/mL (ref 0.0–100.0)

## 2024-02-13 NOTE — Addendum Note (Signed)
 Encounter addended by: Buell Powell HERO, RN on: 02/13/2024 12:01 PM  Actions taken: Order list changed, Diagnosis association updated

## 2024-02-13 NOTE — Patient Instructions (Signed)
 Great to see you today!!!  Medication Changes:  None, continue current medications  Lab Work:  Genetic testing has been collected, this has to be sent to [place]  for processing and can take 1-2 weeks for us  to get results back.  We will let you know the results once reviewed by your provider.   Testing/Procedures:  Your physician has requested that you have an echocardiogram. Echocardiography is a painless test that uses sound waves to create images of your heart. It provides your doctor with information about the size and shape of your heart and how well your heart's chambers and valves are working. This procedure takes approximately one hour. There are no restrictions for this procedure. Please do NOT wear cologne, perfume, aftershave, or lotions (deodorant is allowed). Please arrive 15 minutes prior to your appointment time.  Please note: We ask at that you not bring children with you during ultrasound (echo/ vascular) testing. Due to room size and safety concerns, children are not allowed in the ultrasound rooms during exams. Our front office staff cannot provide observation of children in our lobby area while testing is being conducted. An adult accompanying a patient to their appointment will only be allowed in the ultrasound room at the discretion of the ultrasound technician under special circumstances. We apologize for any inconvenience.   Referrals:  You have been referred to [facility], they will call you to schedule  Special Instructions // Education:  Do the following things EVERYDAY: Weigh yourself in the morning before breakfast. Write it down and keep it in a log. Take your medicines as prescribed Eat low salt foods--Limit salt (sodium) to 2000 mg per day.  Stay as active as you can everyday Limit all fluids for the day to less than 2 liters   Follow-Up in: 6 months (December), **PLEASE CALL OUR OFFICE IN OCTOBER TO SCHEDULE THIS APPOINTMENT   At the  [facility] Clinic, you and your health needs are our priority. We have a designated team specialized in the treatment of Heart Failure. This Care Team includes your primary Heart Failure Specialized Cardiologist (physician), Advanced Practice Providers (APPs- Physician Assistants and Nurse Practitioners), and Pharmacist who all work together to provide you with the care you need, when you need it.   You may see any of the following providers on your designated Care Team at your next follow up:  Dr. Toribio Fuel Dr. Ezra Shuck Dr. Ria Commander Dr. Odis Brownie Greig Mosses, NP Caffie Shed, [place] [facility] [place], [place] Beckey Coe, NP [place] Lee, NP Tinnie Redman, PharmD   Please be sure to bring in all your medications bottles to every appointment.   Need to Contact Us :  If you have any questions or concerns before your next appointment please send us  a message through [place] or call our office at [phone].    TO LEAVE A MESSAGE FOR THE NURSE SELECT OPTION 2, PLEASE LEAVE A MESSAGE INCLUDING: YOUR NAME DATE OF BIRTH CALL BACK NUMBER REASON FOR CALL**this is important as we prioritize the call backs  YOU WILL RECEIVE A CALL BACK THE SAME DAY AS LONG AS YOU CALL BEFORE 4:00 PM

## 2024-02-14 ENCOUNTER — Ambulatory Visit (INDEPENDENT_AMBULATORY_CARE_PROVIDER_SITE_OTHER): Payer: BC Managed Care – PPO

## 2024-02-14 DIAGNOSIS — I429 Cardiomyopathy, unspecified: Secondary | ICD-10-CM

## 2024-02-14 LAB — CUP PACEART REMOTE DEVICE CHECK
Battery Remaining Longevity: 132 mo
Battery Remaining Percentage: 100 %
Brady Statistic RA Percent Paced: 0 %
Brady Statistic RV Percent Paced: 0 %
Date Time Interrogation Session: 20250624020100
HighPow Impedance: 59 Ohm
Implantable Lead Connection Status: 753985
Implantable Lead Implant Date: 20221228
Implantable Lead Location: 753860
Implantable Lead Model: 137
Implantable Lead Serial Number: 301300
Implantable Pulse Generator Implant Date: 20221228
Lead Channel Impedance Value: 517 Ohm
Lead Channel Pacing Threshold Amplitude: 0.8 V
Lead Channel Pacing Threshold Pulse Width: 0.4 ms
Lead Channel Setting Pacing Amplitude: 2 V
Lead Channel Setting Pacing Pulse Width: 0.4 ms
Lead Channel Setting Sensing Sensitivity: 0.5 mV
Lead Channel Setting Sensing Sensitivity: 1 mV
Pulse Gen Serial Number: 390417

## 2024-02-29 ENCOUNTER — Encounter (INDEPENDENT_AMBULATORY_CARE_PROVIDER_SITE_OTHER): Payer: Self-pay

## 2024-03-16 ENCOUNTER — Encounter (INDEPENDENT_AMBULATORY_CARE_PROVIDER_SITE_OTHER): Payer: Self-pay | Admitting: Otolaryngology

## 2024-03-19 NOTE — Progress Notes (Unsigned)
  Electrophysiology Office Note:   ID:  Wendy, Stephens 1971-04-13, MRN [id number]  Primary Cardiologist: Annabella Scarce, MD Electrophysiologist: Elspeth Sage, MD  {Click to update primary MD,subspecialty MD or APP then REFRESH:1}    History of Present Illness:   Wendy Stephens is a 53 y.o. female with h/o chronic systolic CHF, HTN, and NICM seen today for routine electrophysiology followup.   Since last being seen in our clinic the patient reports doing ***.  she denies chest pain, palpitations, dyspnea, PND, orthopnea, nausea, vomiting, dizziness, syncope, edema, weight gain, or early satiety.   Review of systems complete and found to be negative unless listed in HPI.   EP Information / Studies Reviewed:    EKG is not ordered today. EKG from 02/13/2024 reviewed which showed sinus bradycardia with 1st degree AV block       ICD Interrogation-  reviewed in detail today,  See PACEART report.  Arrhythmia/Device History BSX ICD LATITUDE-SK   Physical Exam:   VS:  There were no vitals taken for this visit.   Wt Readings from Last 3 Encounters:  02/13/24 170 lb 6.4 oz (77.3 kg)  10/14/22 171 lb 12.8 oz (77.9 kg)  02/18/22 166 lb 3.2 oz (75.4 kg)     GEN: No acute distress *** NECK: No JVD; No carotid bruits CARDIAC: {EPRHYTHM:28826}, no murmurs, rubs, gallops RESPIRATORY:  Clear to auscultation without rales, wheezing or rhonchi  ABDOMEN: Soft, non-tender, non-distended EXTREMITIES:  {EDEMA LEVEL:28147::No} edema; No deformity   ASSESSMENT AND PLAN:    Chronic systolic CHF  s/p [employer] single chamber ICD  euvolemic today Stable on an appropriate medical regimen Normal ICD function See Pace Art report No changes today  DM2 Per PCP   HTN Stable on current regimen   Obesity There is no height or weight on file to calculate BMI.  Encouraged lifestyle modification    Disposition:   Follow up with {EPPROVIDERS:28135::EP Team} {EPFOLLOW  UP:28173}   Signed, Ozell Prentice Passey, PA-C

## 2024-03-20 ENCOUNTER — Other Ambulatory Visit (HOSPITAL_COMMUNITY): Payer: Self-pay

## 2024-03-20 ENCOUNTER — Encounter: Payer: Self-pay | Admitting: Student

## 2024-03-20 ENCOUNTER — Ambulatory Visit: Attending: Internal Medicine | Admitting: Student

## 2024-03-20 ENCOUNTER — Other Ambulatory Visit (HOSPITAL_COMMUNITY): Payer: Self-pay | Admitting: Internal Medicine

## 2024-03-20 ENCOUNTER — Ambulatory Visit (HOSPITAL_COMMUNITY)
Admission: RE | Admit: 2024-03-20 | Discharge: 2024-03-20 | Disposition: A | Discharge status: Home / Self Care | Source: Ambulatory Visit | Attending: Internal Medicine | Admitting: Internal Medicine

## 2024-03-20 ENCOUNTER — Telehealth (HOSPITAL_COMMUNITY): Payer: Self-pay | Admitting: Cardiology

## 2024-03-20 VITALS — BP 112/70 | HR 60 | Ht 61.0 in | Wt 176.0 lb

## 2024-03-20 DIAGNOSIS — I110 Hypertensive heart disease with heart failure: Secondary | ICD-10-CM | POA: Insufficient documentation

## 2024-03-20 DIAGNOSIS — I5042 Chronic combined systolic (congestive) and diastolic (congestive) heart failure: Secondary | ICD-10-CM | POA: Diagnosis not present

## 2024-03-20 DIAGNOSIS — Z95810 Presence of automatic (implantable) cardiac defibrillator: Secondary | ICD-10-CM | POA: Diagnosis not present

## 2024-03-20 DIAGNOSIS — I071 Rheumatic tricuspid insufficiency: Secondary | ICD-10-CM | POA: Diagnosis not present

## 2024-03-20 DIAGNOSIS — I10 Essential (primary) hypertension: Secondary | ICD-10-CM

## 2024-03-20 LAB — ECHOCARDIOGRAM COMPLETE
Area-P 1/2: 3.85 cm2
Height: 61 in
S' Lateral: 3 cm
Weight: 2816 [oz_av]

## 2024-03-20 LAB — CUP PACEART INCLINIC DEVICE CHECK
Date Time Interrogation Session: 20250729090458
HighPow Impedance: 55 Ohm
Implantable Lead Connection Status: 753985
Implantable Lead Implant Date: 20221228
Implantable Lead Location: 753860
Implantable Lead Model: 137
Implantable Lead Serial Number: 301300
Implantable Pulse Generator Implant Date: 20221228
Lead Channel Impedance Value: 3000 Ohm
Lead Channel Impedance Value: 3000 Ohm
Lead Channel Impedance Value: 521 Ohm
Lead Channel Pacing Threshold Amplitude: 0.8 V
Lead Channel Pacing Threshold Pulse Width: 0.4 ms
Lead Channel Setting Pacing Amplitude: 2 V
Lead Channel Setting Pacing Pulse Width: 0.4 ms
Lead Channel Setting Sensing Sensitivity: 0.5 mV
Lead Channel Setting Sensing Sensitivity: 1 mV
Pulse Gen Serial Number: 390417

## 2024-03-20 MED ORDER — SPIRONOLACTONE 25 MG PO TABS: 25.0000 mg | ORAL_TABLET | Freq: Every day | ORAL | 3 refills | Status: AC

## 2024-03-20 MED ORDER — POTASSIUM CHLORIDE CRYS ER 20 MEQ PO TBCR: 20.0000 meq | EXTENDED_RELEASE_TABLET | Freq: Every day | ORAL | 3 refills | Status: AC | PRN

## 2024-03-20 MED ORDER — FUROSEMIDE 40 MG PO TABS: 40.0000 mg | ORAL_TABLET | Freq: Every day | ORAL | 3 refills | Status: AC | PRN

## 2024-03-20 MED ORDER — SACUBITRIL-VALSARTAN 97-103 MG PO TABS: 1.0000 | ORAL_TABLET | Freq: Two times a day (BID) | ORAL | 3 refills | Status: DC

## 2024-03-20 MED ORDER — CARVEDILOL 3.125 MG PO TABS: 3.1250 mg | ORAL_TABLET | Freq: Two times a day (BID) | ORAL | 3 refills | Status: AC

## 2024-03-20 MED ORDER — FARXIGA 10 MG PO TABS: 10.0000 mg | ORAL_TABLET | Freq: Every day | ORAL | 3 refills | Status: AC

## 2024-03-20 NOTE — Telephone Encounter (Signed)
 Triage walk in  Patient requests #90 day scripts for all meds and co pay assistance for farxiga    Meds sent for #90 and message to pharmacy team to assist with co pay options

## 2024-03-20 NOTE — Patient Instructions (Signed)
 Medication Instructions:  Your physician recommends that you continue on your current medications as directed. Please refer to the Current Medication list given to you today.  *If you need a refill on your cardiac medications before your next appointment, please call your pharmacy*  Lab Work: None ordered If you have labs (blood work) drawn today and your tests are completely normal, you will receive your results only by: MyChart Message (if you have MyChart) OR A paper copy in the mail If you have any lab test that is abnormal or we need to change your treatment, we will call you to review the results.  Follow-Up: At [facility], you and your health needs are our priority.  As part of our continuing mission to provide you with exceptional heart care, our providers are all part of one team.  This team includes your primary Cardiologist (physician) and Advanced Practice Providers or APPs (Physician Assistants and Nurse Practitioners) who all work together to provide you with the care you need, when you need it.  Your next appointment:   1 year(s)  Provider:   You will see one of the following Advanced Practice Providers on your designated Care Team:   Mertha Abrahams, Kennard Pea [address] [place], PA-C Suzann Riddle, NP Creighton Doffing, NP

## 2024-03-23 ENCOUNTER — Encounter: Admitting: Student

## 2024-04-02 ENCOUNTER — Ambulatory Visit: Payer: Self-pay | Admitting: Cardiovascular Disease

## 2024-04-14 ENCOUNTER — Other Ambulatory Visit (HOSPITAL_COMMUNITY): Payer: Self-pay | Admitting: Internal Medicine

## 2024-04-19 ENCOUNTER — Other Ambulatory Visit (HOSPITAL_COMMUNITY): Payer: Self-pay | Admitting: Internal Medicine

## 2024-05-02 ENCOUNTER — Other Ambulatory Visit (HOSPITAL_COMMUNITY): Payer: Self-pay | Admitting: Internal Medicine

## 2024-05-15 ENCOUNTER — Ambulatory Visit (INDEPENDENT_AMBULATORY_CARE_PROVIDER_SITE_OTHER): Payer: BC Managed Care – PPO

## 2024-05-15 DIAGNOSIS — I5042 Chronic combined systolic (congestive) and diastolic (congestive) heart failure: Secondary | ICD-10-CM

## 2024-05-16 LAB — CUP PACEART REMOTE DEVICE CHECK
Battery Remaining Longevity: 132 mo
Battery Remaining Percentage: 100 %
Brady Statistic RA Percent Paced: 0 %
Brady Statistic RV Percent Paced: 0 %
Date Time Interrogation Session: 20250923020100
HighPow Impedance: 58 Ohm
Implantable Lead Connection Status: 753985
Implantable Lead Implant Date: 20221228
Implantable Lead Location: 753860
Implantable Lead Model: 137
Implantable Lead Serial Number: 301300
Implantable Pulse Generator Implant Date: 20221228
Lead Channel Impedance Value: 533 Ohm
Lead Channel Pacing Threshold Amplitude: 0.7 V
Lead Channel Pacing Threshold Pulse Width: 0.4 ms
Lead Channel Setting Pacing Amplitude: 2 V
Lead Channel Setting Pacing Pulse Width: 0.4 ms
Lead Channel Setting Sensing Sensitivity: 0.5 mV
Lead Channel Setting Sensing Sensitivity: 1 mV
Pulse Gen Serial Number: 390417

## 2024-05-17 NOTE — Progress Notes (Signed)
Remote ICD Transmission.

## 2024-05-18 NOTE — Progress Notes (Signed)
Remote ICD Transmission.

## 2024-05-29 ENCOUNTER — Ambulatory Visit: Payer: Self-pay | Admitting: Cardiovascular Disease

## 2024-07-04 ENCOUNTER — Ambulatory Visit: Payer: Self-pay | Admitting: Cardiovascular Disease

## 2024-08-14 ENCOUNTER — Ambulatory Visit: Payer: BC Managed Care – PPO

## 2024-08-14 DIAGNOSIS — I5042 Chronic combined systolic (congestive) and diastolic (congestive) heart failure: Secondary | ICD-10-CM

## 2024-08-15 LAB — CUP PACEART REMOTE DEVICE CHECK
Battery Remaining Longevity: 132 mo
Battery Remaining Percentage: 100 %
Brady Statistic RA Percent Paced: 0 %
Brady Statistic RV Percent Paced: 0 %
Date Time Interrogation Session: 20251223025300
HighPow Impedance: 58 Ohm
Implantable Lead Connection Status: 753985
Implantable Lead Implant Date: 20221228
Implantable Lead Location: 753860
Implantable Lead Model: 137
Implantable Lead Serial Number: 301300
Implantable Pulse Generator Implant Date: 20221228
Lead Channel Impedance Value: 511 Ohm
Lead Channel Pacing Threshold Amplitude: 0.7 V
Lead Channel Pacing Threshold Pulse Width: 0.4 ms
Lead Channel Setting Pacing Amplitude: 2 V
Lead Channel Setting Pacing Pulse Width: 0.4 ms
Lead Channel Setting Sensing Sensitivity: 0.5 mV
Lead Channel Setting Sensing Sensitivity: 1 mV
Pulse Gen Serial Number: 390417

## 2024-08-17 NOTE — Progress Notes (Signed)
 Remote ICD Transmission

## 2024-08-22 ENCOUNTER — Ambulatory Visit: Payer: Self-pay | Admitting: Cardiovascular Disease
